# Patient Record
Sex: Male | Born: 1950 | Race: White | Hispanic: No | Marital: Married | Smoking: Former smoker
Health system: Southern US, Community
[De-identification: ages and names within clinical notes are randomized; demographics above are authoritative.]

## PROBLEM LIST (undated history)

## (undated) DIAGNOSIS — E785 Hyperlipidemia, unspecified: Secondary | ICD-10-CM

## (undated) DIAGNOSIS — C801 Malignant (primary) neoplasm, unspecified: Secondary | ICD-10-CM

## (undated) DIAGNOSIS — I10 Essential (primary) hypertension: Secondary | ICD-10-CM

---

## 1999-10-01 ENCOUNTER — Ambulatory Visit (HOSPITAL_BASED_OUTPATIENT_CLINIC_OR_DEPARTMENT_OTHER): Admission: RE | Admit: 1999-10-01 | Discharge: 1999-10-01 | Payer: Self-pay | Admitting: Urology

## 2006-06-29 ENCOUNTER — Ambulatory Visit: Payer: Self-pay | Admitting: Internal Medicine

## 2006-09-09 ENCOUNTER — Emergency Department (HOSPITAL_COMMUNITY): Admission: EM | Admit: 2006-09-09 | Discharge: 2006-09-09 | Payer: Self-pay | Admitting: Family Medicine

## 2007-07-18 ENCOUNTER — Encounter: Payer: Self-pay | Admitting: Internal Medicine

## 2008-06-25 ENCOUNTER — Emergency Department (HOSPITAL_COMMUNITY): Admission: EM | Admit: 2008-06-25 | Discharge: 2008-06-25 | Payer: Self-pay | Admitting: Emergency Medicine

## 2010-11-28 NOTE — Op Note (Signed)
[place] [place]  Patient:    Mike Donaldson, Mike Donaldson                       MRN: [id number] Proc. Date: 10/01/99 Adm. Date:  [id number] Attending:  Lauree Chandler                           Operative Report  PREOPERATIVE DIAGNOSIS:  Recurrent balanitis and phimosis.  POSTOPERATIVE DIAGNOSIS:  Recurrent balanitis and phimosis.  PROCEDURE:  Circumcision.  SURGEON:  Maretta Bees. Vonita Moss, M.D.  ANESTHESIA:  MAC with local.  INDICATIONS:  This 60 year old gentleman has had a recurrent history of cracking and splitting and irritation under the foreskin, and he has a tight foreskin that is redundant.  He is brought to the operating room for correction of this problem. He was advised about the risks of infection or bleeding.  DESCRIPTION OF PROCEDURE:  The patient was brought to the operating room and placed in the supine position.  The external genitalia were prepped and draped in the usual fashion.  Xylocaine 1% plain was injected around the base of the penis, to obtain local anesthesia.  A circumcision was performed using the sleeve technique. Hemostasis was obtained by the use of electrocautery and plain catgut ties, and  #4-0 chromic catgut was placed at 3, 6, 9, and 12 oclock, to approximately the distal edge of the penile shaft skin to the residual edge of the mucosal foreskin. Marcaine 0.25% was then injected proximally for postoperative analgesia.  The circumcision was then completed by running #4-0 chromic catgut between each quadrant suture.  The wound was cleaned and dressed with Betadine ointment and Vaseline gauze and dry sterile gauze and Coban.  He was taken to the recovery room in good condition, with a negligible blood loss. DD:  10/01/99 TD:  10/01/99 Job: [id number]

## 2018-02-10 ENCOUNTER — Observation Stay (HOSPITAL_COMMUNITY)
Admission: EM | Admit: 2018-02-10 | Discharge: 2018-02-12 | Disposition: A | Discharge status: Home / Self Care | Payer: Medicare Other | Attending: Cardiovascular Disease | Admitting: Cardiovascular Disease

## 2018-02-10 ENCOUNTER — Emergency Department (HOSPITAL_COMMUNITY): Payer: Medicare Other

## 2018-02-10 ENCOUNTER — Encounter (HOSPITAL_COMMUNITY): Payer: Self-pay | Admitting: Emergency Medicine

## 2018-02-10 ENCOUNTER — Other Ambulatory Visit: Payer: Self-pay

## 2018-02-10 DIAGNOSIS — R0789 Other chest pain: Secondary | ICD-10-CM | POA: Diagnosis not present

## 2018-02-10 DIAGNOSIS — K219 Gastro-esophageal reflux disease without esophagitis: Secondary | ICD-10-CM | POA: Insufficient documentation

## 2018-02-10 DIAGNOSIS — I10 Essential (primary) hypertension: Secondary | ICD-10-CM

## 2018-02-10 DIAGNOSIS — R001 Bradycardia, unspecified: Secondary | ICD-10-CM | POA: Insufficient documentation

## 2018-02-10 DIAGNOSIS — Z885 Allergy status to narcotic agent status: Secondary | ICD-10-CM | POA: Diagnosis not present

## 2018-02-10 DIAGNOSIS — Z79899 Other long term (current) drug therapy: Secondary | ICD-10-CM | POA: Diagnosis not present

## 2018-02-10 DIAGNOSIS — R072 Precordial pain: Secondary | ICD-10-CM | POA: Diagnosis not present

## 2018-02-10 DIAGNOSIS — R0602 Shortness of breath: Secondary | ICD-10-CM

## 2018-02-10 DIAGNOSIS — I2510 Atherosclerotic heart disease of native coronary artery without angina pectoris: Secondary | ICD-10-CM | POA: Diagnosis not present

## 2018-02-10 DIAGNOSIS — R0609 Other forms of dyspnea: Secondary | ICD-10-CM | POA: Insufficient documentation

## 2018-02-10 DIAGNOSIS — Z87891 Personal history of nicotine dependence: Secondary | ICD-10-CM | POA: Diagnosis not present

## 2018-02-10 DIAGNOSIS — E785 Hyperlipidemia, unspecified: Secondary | ICD-10-CM | POA: Insufficient documentation

## 2018-02-10 DIAGNOSIS — R079 Chest pain, unspecified: Secondary | ICD-10-CM | POA: Diagnosis present

## 2018-02-10 HISTORY — DX: Hyperlipidemia, unspecified: E78.5

## 2018-02-10 HISTORY — DX: Essential (primary) hypertension: I10

## 2018-02-10 LAB — BASIC METABOLIC PANEL
Anion gap: 10 (ref 5–15)
BUN: 16 mg/dL (ref 8–23)
CALCIUM: 8.7 mg/dL — AB (ref 8.9–10.3)
CHLORIDE: 107 mmol/L (ref 98–111)
CO2: 24 mmol/L (ref 22–32)
CREATININE: 1.17 mg/dL (ref 0.61–1.24)
Glucose, Bld: 138 mg/dL — ABNORMAL HIGH (ref 70–99)
Potassium: 3.4 mmol/L — ABNORMAL LOW (ref 3.5–5.1)
SODIUM: 141 mmol/L (ref 135–145)

## 2018-02-10 LAB — CBC
HCT: 45.8 % (ref 39.0–52.0)
Hemoglobin: 15.5 g/dL (ref 13.0–17.0)
MCH: 30.1 pg (ref 26.0–34.0)
MCHC: 33.8 g/dL (ref 30.0–36.0)
MCV: 88.9 fL (ref 78.0–100.0)
PLATELETS: 257 10*3/uL (ref 150–400)
RBC: 5.15 MIL/uL (ref 4.22–5.81)
RDW: 14 % (ref 11.5–15.5)
WBC: 7.9 10*3/uL (ref 4.0–10.5)

## 2018-02-10 LAB — TROPONIN I: Troponin I: <0.03 ng/mL (ref <0.03)

## 2018-02-10 LAB — I-STAT TROPONIN, ED: TROPONIN I, POC: 0 ng/mL (ref 0.00–0.08)

## 2018-02-10 LAB — BRAIN NATRIURETIC PEPTIDE: B Natriuretic Peptide: 65.8 pg/mL (ref 0.0–100.0)

## 2018-02-10 MED ORDER — ATORVASTATIN CALCIUM 40 MG PO TABS
40.0000 mg | ORAL_TABLET | Freq: Every day | ORAL | Status: DC
  Administered 2018-02-10 – 2018-02-11 (×2): 40 mg via ORAL
  Filled 2018-02-10 (×2): qty 1

## 2018-02-10 MED ORDER — AMLODIPINE BESYLATE 10 MG PO TABS
10.0000 mg | ORAL_TABLET | Freq: Every day | ORAL | Status: DC
  Administered 2018-02-11 – 2018-02-12 (×2): 10 mg via ORAL
  Filled 2018-02-10 (×2): qty 1

## 2018-02-10 MED ORDER — HYDROCHLOROTHIAZIDE 25 MG PO TABS
25.0000 mg | ORAL_TABLET | Freq: Every day | ORAL | Status: DC
  Administered 2018-02-11 – 2018-02-12 (×2): 25 mg via ORAL
  Filled 2018-02-10 (×2): qty 1

## 2018-02-10 MED ORDER — POTASSIUM CHLORIDE CRYS ER 20 MEQ PO TBCR
40.0000 meq | EXTENDED_RELEASE_TABLET | Freq: Every day | ORAL | Status: DC
  Administered 2018-02-11 – 2018-02-12 (×2): 40 meq via ORAL
  Filled 2018-02-10 (×2): qty 2

## 2018-02-10 MED ORDER — PANTOPRAZOLE SODIUM 40 MG PO TBEC
40.0000 mg | DELAYED_RELEASE_TABLET | Freq: Every day | ORAL | Status: DC
  Administered 2018-02-11 – 2018-02-12 (×2): 40 mg via ORAL
  Filled 2018-02-10 (×2): qty 1

## 2018-02-10 MED ORDER — METHOCARBAMOL 500 MG PO TABS: 500.0000 mg | ORAL_TABLET | Freq: Every evening | ORAL | Status: DC | PRN

## 2018-02-10 MED ORDER — ATORVASTATIN CALCIUM 20 MG PO TABS: 20.0000 mg | ORAL_TABLET | Freq: Every day | ORAL | Status: DC

## 2018-02-10 MED ORDER — ENOXAPARIN SODIUM 40 MG/0.4ML ~~LOC~~ SOLN
40.0000 mg | SUBCUTANEOUS | Status: DC
  Administered 2018-02-10 – 2018-02-11 (×2): 40 mg via SUBCUTANEOUS
  Filled 2018-02-10 (×2): qty 0.4

## 2018-02-10 MED ORDER — ACETAMINOPHEN 325 MG PO TABS
650.0000 mg | ORAL_TABLET | ORAL | Status: DC | PRN
  Administered 2018-02-12: 650 mg via ORAL
  Filled 2018-02-10: qty 2

## 2018-02-10 MED ORDER — NITROGLYCERIN 0.4 MG SL SUBL
0.4000 mg | SUBLINGUAL_TABLET | SUBLINGUAL | Status: DC | PRN
  Filled 2018-02-10: qty 1

## 2018-02-10 MED ORDER — ASPIRIN 81 MG PO CHEW
324.0000 mg | CHEWABLE_TABLET | Freq: Once | ORAL | Status: AC
Start: 2018-02-10 — End: 2018-02-10
  Administered 2018-02-10: 324 mg via ORAL
  Filled 2018-02-10: qty 4

## 2018-02-10 MED ORDER — METOPROLOL TARTRATE 25 MG PO TABS
12.5000 mg | ORAL_TABLET | Freq: Two times a day (BID) | ORAL | Status: DC
  Administered 2018-02-10: 12.5 mg via ORAL
  Filled 2018-02-10: qty 1

## 2018-02-10 MED ORDER — ASPIRIN EC 81 MG PO TBEC
81.0000 mg | DELAYED_RELEASE_TABLET | Freq: Every day | ORAL | Status: DC
  Administered 2018-02-11 – 2018-02-12 (×2): 81 mg via ORAL
  Filled 2018-02-10 (×2): qty 1

## 2018-02-10 MED ORDER — NITROGLYCERIN 0.4 MG SL SUBL: 0.4000 mg | SUBLINGUAL_TABLET | SUBLINGUAL | Status: DC | PRN

## 2018-02-10 MED ORDER — ONDANSETRON HCL 4 MG/2ML IJ SOLN: 4.0000 mg | Freq: Four times a day (QID) | INTRAMUSCULAR | Status: DC | PRN

## 2018-02-10 NOTE — ED Notes (Signed)
Pt declining Nitro at this time and states he is not currently experiencing any CP.

## 2018-02-10 NOTE — ED Triage Notes (Signed)
Pt states for the last 2 weeks he has been having intermittent episodes of chest pain associated with sob and lightheadedness. Pt was seen at [facility] clinic today and told him to come by ED just to be further evaluated.

## 2018-02-10 NOTE — Plan of Care (Signed)
Denies any pain at this time/.

## 2018-02-10 NOTE — ED Provider Notes (Signed)
[facility] EMERGENCY DEPARTMENT Provider Note   CSN: [id number] Arrival date & time: 02/10/18  1429     History   Chief Complaint Chief Complaint  Patient presents with  . Chest Pain    HPI Mike Donaldson. is a 67 y.o. male.  HPI  Pt was seen at 1620. Per pt and his wife, c/o gradual onset and worsening of multiple intermittent episodes of chest "pain" for the past 2 months, worse over the past 4 days. Pt states he has been having mid-sternal chest "heaviness" and "pressure," associated with SOB, lasting approximately 15 minutes each episode. Occurs on exertion, improves with rest. Pt states 4 days ago, he woke up from sleep with SOB, nausea, chest "heaviness" and fleeting 3 second "sharp" mid-sternal CP. Pt states the "heaviness" lasted for approximately 30-9min before resolving. Pt states he then sat up in a chair for the rest of the night. Pt states he told his PMD at the [place] today, then was sent to the ED for further evaluation. Denies vomiting/diarrhea, no back pain, no abd pain, no palpitations, no cough, no fevers, no calf/LE swelling.   Past Medical History:  Diagnosis Date  . Hyperlipidemia   . Hypertension     There are no active problems to display for this patient.   History reviewed. No pertinent surgical history.      Home Medications    Prior to Admission medications   Medication Sig Start Date End Date Taking? Authorizing Provider  amLODipine (NORVASC) 10 MG tablet Take 10 mg by mouth daily.   Yes [provider]  atorvastatin (LIPITOR) 20 MG tablet Take 20 mg by mouth daily at 6 PM.   Yes [provider]  Brinzolamide-Brimonidine 1-0.2 % SUSP Place 1 drop into both eyes 2 (two) times daily.   Yes [provider]  hydrochlorothiazide (HYDRODIURIL) 25 MG tablet Take 25 mg by mouth daily. Hold for systolic blood pressure less than 100   Yes [provider]  meloxicam (MOBIC) 7.5 MG tablet Take 7.5 mg by  mouth daily as needed for pain.   Yes [provider]  methocarbamol (ROBAXIN) 500 MG tablet Take 500 mg by mouth at bedtime as needed for muscle spasms.   Yes [provider]  metoprolol tartrate (LOPRESSOR) 25 MG tablet Take 12.5 mg by mouth 2 (two) times daily.   Yes [provider]  omeprazole (PRILOSEC) 20 MG capsule Take 20 mg by mouth daily.   Yes [provider]  potassium chloride SA (K-DUR,KLOR-CON) 20 MEQ tablet Take 40 mEq by mouth daily.   Yes [provider]    Family History No family history on file.  Social History Social History   Tobacco Use  . Smoking status: Not on file  Substance Use Topics  . Alcohol use: Not on file  . Drug use: Not on file     Allergies   Codeine   Review of Systems Review of Systems ROS: Statement: All systems negative except as marked or noted in the HPI; Constitutional: Negative for fever and chills. ; ; Eyes: Negative for eye pain, redness and discharge. ; ; ENMT: Negative for ear pain, hoarseness, nasal congestion, sinus pressure and sore throat. ; ; Cardiovascular: +CP, SOB. Negative for palpitations, diaphoresis, and peripheral edema. ; ; Respiratory: Negative for cough, wheezing and stridor. ; ; Gastrointestinal: +nausea. Negative for vomiting, diarrhea, abdominal pain, blood in stool, hematemesis, jaundice and rectal bleeding. . ; ; Genitourinary: Negative for  dysuria, flank pain and hematuria. ; ; Musculoskeletal: Negative for back pain and neck pain. Negative for swelling and trauma.; ; Skin: Negative for pruritus, rash, abrasions, blisters, bruising and skin lesion.; ; Neuro: Negative for headache, lightheadedness and neck stiffness. Negative for weakness, altered level of consciousness, altered mental status, extremity weakness, paresthesias, involuntary movement, seizure and syncope.       Physical Exam Updated Vital Signs BP (!) 147/72   Pulse (!) 55   Temp 98.4 F (36.9 C)  (Oral)   Resp 16   SpO2 97%    BP 138/88   Pulse (!) 55   Temp 98.4 F (36.9 C) (Oral)   Resp 13   SpO2 94%    Physical Exam 1625: Physical examination:  Nursing notes reviewed; Vital signs and O2 SAT reviewed;  Constitutional: Well developed, Well nourished, Well hydrated, In no acute distress; Head:  Normocephalic, atraumatic; Eyes: EOMI, PERRL, No scleral icterus; ENMT: Mouth and pharynx normal, Mucous membranes moist; Neck: Supple, Full range of motion, No lymphadenopathy; Cardiovascular: Bradycardic rate and rhythm, No gallop; Respiratory: Breath sounds clear & equal bilaterally, No wheezes.  Speaking full sentences with ease, Normal respiratory effort/excursion; Chest: Nontender, Movement normal; Abdomen: Soft, Nontender, Nondistended, Normal bowel sounds; Genitourinary: No CVA tenderness; Extremities: Peripheral pulses normal, No tenderness, No edema, No calf edema or asymmetry.; Neuro: AA&Ox3, Major CN grossly intact.  Speech clear. No gross focal motor or sensory deficits in extremities.; Skin: Color normal, Warm, Dry.   ED Treatments / Results  Labs (all labs ordered are listed, but only abnormal results are displayed)   EKG EKG Interpretation  Date/Time:  Thursday February 10 2018 14:36:22 EDT Ventricular Rate:  53 PR Interval:  176 QRS Duration: 96 QT Interval:  432 QTC Calculation: 405 R Axis:   9 Text Interpretation:  Sinus bradycardia with sinus arrhythmia Incomplete right bundle branch block No old tracing to compare Confirmed by Francine Graven [id number]) on 02/10/2018 4:30:42 PM   Radiology   Procedures Procedures (including critical care time)  Medications Ordered in ED Medications  aspirin chewable tablet 324 mg (has no administration in time range)  nitroGLYCERIN (NITROSTAT) SL tablet 0.4 mg (has no administration in time range)     Initial Impression / Assessment and Plan / ED Course  I have reviewed the triage vital signs and the nursing  notes.  Pertinent labs & imaging results that were available during my care of the patient were reviewed by me and considered in my medical decision making (see chart for details).  MDM Reviewed: nursing note and vitals Interpretation: labs, ECG and x-ray   Results for orders placed or performed during the hospital encounter of 11/94/17  Basic metabolic panel  Result Value Ref Range   Sodium 141 135 - 145 mmol/L   Potassium 3.4 (L) 3.5 - 5.1 mmol/L   Chloride 107 98 - 111 mmol/L   CO2 24 22 - 32 mmol/L   Glucose, Bld 138 (H) 70 - 99 mg/dL   BUN 16 8 - 23 mg/dL   Creatinine, Ser 1.17 0.61 - 1.24 mg/dL   Calcium 8.7 (L) 8.9 - 10.3 mg/dL   GFR calc non Af Amer >60 >60 mL/min   GFR calc Af Amer >60 >60 mL/min   Anion gap 10 5 - 15  CBC  Result Value Ref Range   WBC 7.9 4.0 - 10.5 K/uL   RBC 5.15 4.22 - 5.81 MIL/uL   Hemoglobin 15.5 13.0 - 17.0 g/dL   HCT 45.8  39.0 - 52.0 %   MCV 88.9 78.0 - 100.0 fL   MCH 30.1 26.0 - 34.0 pg   MCHC 33.8 30.0 - 36.0 g/dL   RDW 14.0 11.5 - 15.5 %   Platelets 257 150 - 400 K/uL  Brain natriuretic peptide  Result Value Ref Range   B Natriuretic Peptide 65.8 0.0 - 100.0 pg/mL  I-stat troponin, ED  Result Value Ref Range   Troponin i, poc 0.00 0.00 - 0.08 ng/mL   Comment 3           Dg Chest 2 View Result Date: 02/10/2018 CLINICAL DATA:  Shortness of breath x2 months EXAM: CHEST - 2 VIEW COMPARISON:  None. FINDINGS: Lungs are clear.  No pleural effusion or pneumothorax. The heart is normal in size. Mild degenerative changes of the visualized thoracolumbar spine. IMPRESSION: Normal chest radiographs. Electronically Signed   By: Julian Hy M.D.   On: 02/10/2018 15:02    1745:  ASA given. Pt denies symptoms while at rest/currently. Concerning HPI, will admit. Dx and testing d/w pt and family.  Questions answered.  Verb understanding, agreeable to admit.  T/C returned from [facility] Dr. Oval Linsey, case discussed, including:  HPI, pertinent PM/SHx,  VS/PE, dx testing, ED course and treatment:  Agrees regarding concerning HPI, requests to admit to [facility] service and [facility] can consult prn.   1815:  T/C returned from [facility] Dr. Shanon Brow, case discussed, including:  HPI, pertinent PM/SHx, VS/PE, dx testing, ED course and treatment:  Agreeable to admit.   1820:  Cards Dr. Oval Linsey has evaluated pt: states [facility] service will admit. [facility] MD informed.      Final Clinical Impressions(s) / ED Diagnoses   Final diagnoses:  None    ED Discharge Orders    None       Francine Graven, DO 02/13/18 1338

## 2018-02-10 NOTE — H&P (Signed)
Cardiology Admission History and Physical:   Patient ID: Mike Donaldson.; MRN: [id number]; DOB: 1950-09-30   Admission date: 02/10/2018  Primary Care Provider: Patient, No Pcp Per [place] Primary Cardiologist: [name], MD    Chief Complaint:  Chest pain  Patient Profile:   Mike Donaldson. is a 68 y.o. male with a hx of hypertension, carotid stenosis s/p L CEA, and GERD  who is being seen today for the evaluation of chest pain and shortness of breath.  History of Present Illness:   Mike Donaldson reports several months of intermittent chest pain.  He notices it most often when lying down or sitting.  The pain is substernal and doesn't radiate.  It is associated with shortness of breath but no nausea or diaphoresis.  The episodes last for up to 20 minutes at a time and seem to be better with walking.    He doesn't get much formal exercise but is very aive around his home and with his horses.  This doesn't cause chest pain but he does get short of breath.  The shortness of breath has been much worse in the last two weeks.   Past Medical History:  Diagnosis Date  . Hyperlipidemia   . Hypertension     History reviewed. No pertinent surgical history.   Medications Prior to Admission: Prior to Admission medications   Medication Sig Start Date End Date Taking? Authorizing Provider  amLODipine (NORVASC) 10 MG tablet Take 10 mg by mouth daily.   Yes [provider]  atorvastatin (LIPITOR) 20 MG tablet Take 20 mg by mouth daily at 6 PM.   Yes [provider]  Brinzolamide-Brimonidine 1-0.2 % SUSP Place 1 drop into both eyes 2 (two) times daily.   Yes [provider]  hydrochlorothiazide (HYDRODIURIL) 25 MG tablet Take 25 mg by mouth daily. Hold for systolic blood pressure less than 100   Yes [provider]  meloxicam (MOBIC) 7.5 MG tablet Take 7.5 mg by mouth daily as needed for pain.   Yes [provider]  methocarbamol (ROBAXIN) 500 MG  tablet Take 500 mg by mouth at bedtime as needed for muscle spasms.   Yes [provider]  metoprolol tartrate (LOPRESSOR) 25 MG tablet Take 12.5 mg by mouth 2 (two) times daily.   Yes [provider]  omeprazole (PRILOSEC) 20 MG capsule Take 20 mg by mouth daily.   Yes [provider]  potassium chloride SA (K-DUR,KLOR-CON) 20 MEQ tablet Take 40 mEq by mouth daily.   Yes [provider]     Allergies:    Allergies  Allergen Reactions  . Codeine Anaphylaxis    Social History:   Social History   Socioeconomic History  . Marital status: Married    Spouse name: Not on file  . Number of children: Not on file  . Years of education: Not on file  . Highest education level: Not on file  Occupational History  . Not on file  Social Needs  . Financial resource strain: Not on file  . Food insecurity:    Worry: Not on file    Inability: Not on file  . Transportation needs:    Medical: Not on file    Non-medical: Not on file  Tobacco Use  . Smoking status: Former Smoker    Types: Cigars  . Smokeless tobacco: Former Network engineer and Sexual Activity  . Alcohol use: Not on file  . Drug use: Not on file  .  Sexual activity: Not on file  Lifestyle  . Physical activity:    Days per week: Not on file    Minutes per session: Not on file  . Stress: Not on file  Relationships  . Social connections:    Talks on phone: Not on file    Gets together: Not on file    Attends religious service: Not on file    Active member of club or organization: Not on file    Attends meetings of clubs or organizations: Not on file    Relationship status: Not on file  . Intimate partner violence:    Fear of current or ex partner: Not on file    Emotionally abused: Not on file    Physically abused: Not on file    Forced sexual activity: Not on file  Other Topics Concern  . Not on file  Social History Narrative  . Not on file    Family History:   The patient's  family history includes Heart attack in his brother and mother.    ROS:  Please see the history of present illness.  All other ROS reviewed and negative.     Physical Exam/Data:   Vitals:   02/10/18 2200 02/11/18 0100 02/11/18 0104 02/11/18 0534  BP:   131/66 122/69  Pulse: (!) 56 (!) 48 (!) 42 (!) 50  Resp: 13 (!) 24 15 18   Temp:    97.9 F (36.6 C)  TempSrc:    Oral  SpO2: 99% 96% 99% 99%  Weight:    195 lb 15.8 oz (88.9 kg)  Height:        Intake/Output Summary (Last 24 hours) at 02/11/2018 0617 Last data filed at 02/11/2018 0103 Gross per 24 hour  Intake -  Output 470 ml  Net -470 ml   Filed Weights   02/10/18 2014 02/11/18 0534  Weight: 199 lb 4.7 oz (90.4 kg) 195 lb 15.8 oz (88.9 kg)  VS:  BP 122/69 (BP Location: Left Arm)   Pulse (!) 50   Temp 97.9 F (36.6 C) (Oral)   Resp 18   Ht 5\' 9"  (1.753 m)   Wt 195 lb 15.8 oz (88.9 kg)   SpO2 99%   BMI 28.94 kg/m  , BMI Body mass index is 28.94 kg/m. GENERAL:  Well appearing.  No acute distress HEENT: Pupils equal round and reactive, fundi not visualized, oral mucosa unremarkable NECK:  No jugular venous distention, waveform within normal limits, carotid upstroke brisk and symmetric, no bruits LUNGS:  Clear to auscultation bilaterally HEART:  RRR.  PMI not displaced or sustained,S1 and S2 within normal limits, no S3, no S4, no clicks, no rubs, no murmurs ABD:  Flat, positive bowel sounds normal in frequency in pitch, no bruits, no rebound, no guarding, no midline pulsatile mass, no hepatomegaly, no splenomegaly EXT:  2 plus pulses throughout, no edema, no cyanosis no clubbing SKIN:  No rashes no nodules NEURO:  Cranial nerves II through XII grossly intact, motor grossly intact throughout PSYCH:  Cognitively intact, oriented to person place and time   EKG:  The ECG that was done 02/10/18 was personally reviewed and demonstrates sinus bradycardia.  Rate 53 bpm.  Relevant CV Studies: n/a  Laboratory  Data:  Chemistry Recent Labs  Lab 02/10/18 1450  NA 141  K 3.4*  CL 107  CO2 24  GLUCOSE 138*  BUN 16  CREATININE 1.17  CALCIUM 8.7*  GFRNONAA >60  GFRAA >60  ANIONGAP 10  No results for input(s): PROT, ALBUMIN, AST, ALT, ALKPHOS, BILITOT in the last 168 hours. Hematology Recent Labs  Lab 02/10/18 1450  WBC 7.9  RBC 5.15  HGB 15.5  HCT 45.8  MCV 88.9  MCH 30.1  MCHC 33.8  RDW 14.0  PLT 257   Cardiac Enzymes Recent Labs  Lab 02/10/18 1855 02/11/18 0051  TROPONINI <0.03 <0.03    Recent Labs  Lab 02/10/18 1452  TROPIPOC 0.00    BNP Recent Labs  Lab 02/10/18 1450  BNP 65.8    DDimer No results for input(s): DDIMER in the last 168 hours.  Radiology/Studies:  Dg Chest 2 View  Result Date: 02/10/2018 CLINICAL DATA:  Shortness of breath x2 months EXAM: CHEST - 2 VIEW COMPARISON:  None. FINDINGS: Lungs are clear.  No pleural effusion or pneumothorax. The heart is normal in size. Mild degenerative changes of the visualized thoracolumbar spine. IMPRESSION: Normal chest radiographs. Electronically Signed   By: Julian Hy M.D.   On: 02/10/2018 15:02    Assessment and Plan:   # Chest pain: # Exertional dyspnea:   Mike Donaldson has atypical chest pain that occurs mostly at rest and when lying down.  However he also has exertional dyspnea that is concerning for ischemia.  Cycle cardiac enzymes.  If negative, we will get a Lexiscan Myoview and an echo.  # Hypertension: Continue home amlodipine, HCTZ and metoprolol.  # s/p L CEA: # Hyperlipidemia: Continue rosuvastatin and aspirin.  Check lipids.  He is [facility] at the [facility] for this.   Severity of Illness: The appropriate patient status for this patient is OBSERVATION. Observation status is judged to be reasonable and necessary in order to provide the required intensity of service to ensure the patient's safety. The patient's presenting symptoms, physical exam findings, and initial radiographic and  laboratory data in the context of their medical condition is felt to place them at decreased risk for further clinical deterioration. Furthermore, it is anticipated that the patient will be medically stable for discharge from the hospital within 2 midnights of admission. The following factors support the patient status of observation.   " The patient's presenting symptoms include atypical chest pain and shortness of breath. " The physical exam findings include  euvolemic. " The initial radiographic and laboratory data are troponin negative and EKG unremarkable thus far.     For questions or updates, please contact [place] Please consult [email or web address] for contact info under Cardiology/STEMI.    Signed, [name], MD  02/11/2018 6:17 AM

## 2018-02-10 NOTE — ED Notes (Signed)
Cardiologist notified about HR dipping into the 40's and then coming back up. Pt is asymptomatic.  Per Cardiology team and MD Oval Linsey, continue to monitor pt.

## 2018-02-10 NOTE — Progress Notes (Signed)
Patient admitted from ED with chest pain. Denies any pain at this time. Oriented to room and surroundings. Telemetry applied and ccmd called. Vitals stable. Family at bedside.

## 2018-02-10 NOTE — ED Provider Notes (Signed)
Patient placed in Quick Look pathway, seen and evaluated   Chief Complaint: Chest pain  HPI:   Patient presents with a 5-day history of intermittent chest pain.  He describes it as a pressure with intermittent, fleeting sharp pain that lasts for about 3 to 4 seconds.  He has also noticed worsening fatigue lately.  He reports he will work for 3 to 4 hours and become very tired, which is abnormal for him.  He has had associated shortness of breath.  His pain has come at rest and on exertion.  He reports that sitting up to seem to make it better.  He has had some bilateral lower extremity pain, but no significant edema that he is noticed.  He denies any recent long trips, surgeries, history of blood clots.  ROS: +Chest pain, shortness of breath  Physical Exam:   Gen: No distress  Neuro: Awake and Alert  Skin: Warm    Focused Exam: Heart normal rate rhythm, lungs clear to auscultation, abdomen soft and nontender, no calf tenderness   Initiation of care has begun. The patient has been counseled on the process, plan, and necessity for staying for the completion/evaluation, and the remainder of the medical screening examination    Frederica Kuster, PA-C 02/10/18 [place], DO 02/10/18 1536

## 2018-02-11 ENCOUNTER — Observation Stay (HOSPITAL_BASED_OUTPATIENT_CLINIC_OR_DEPARTMENT_OTHER): Payer: Medicare Other

## 2018-02-11 ENCOUNTER — Encounter (HOSPITAL_COMMUNITY): Payer: Self-pay | Admitting: Cardiovascular Disease

## 2018-02-11 DIAGNOSIS — R0600 Dyspnea, unspecified: Secondary | ICD-10-CM | POA: Diagnosis not present

## 2018-02-11 DIAGNOSIS — I2510 Atherosclerotic heart disease of native coronary artery without angina pectoris: Secondary | ICD-10-CM | POA: Diagnosis not present

## 2018-02-11 DIAGNOSIS — R0789 Other chest pain: Secondary | ICD-10-CM | POA: Diagnosis not present

## 2018-02-11 DIAGNOSIS — R0602 Shortness of breath: Secondary | ICD-10-CM | POA: Diagnosis not present

## 2018-02-11 DIAGNOSIS — I10 Essential (primary) hypertension: Secondary | ICD-10-CM | POA: Diagnosis not present

## 2018-02-11 DIAGNOSIS — R079 Chest pain, unspecified: Secondary | ICD-10-CM | POA: Diagnosis not present

## 2018-02-11 DIAGNOSIS — R072 Precordial pain: Secondary | ICD-10-CM | POA: Diagnosis not present

## 2018-02-11 DIAGNOSIS — R0609 Other forms of dyspnea: Secondary | ICD-10-CM | POA: Diagnosis not present

## 2018-02-11 LAB — BASIC METABOLIC PANEL
Anion gap: 12 (ref 5–15)
BUN: 16 mg/dL (ref 8–23)
CALCIUM: 8.6 mg/dL — AB (ref 8.9–10.3)
CHLORIDE: 106 mmol/L (ref 98–111)
CO2: 22 mmol/L (ref 22–32)
CREATININE: 1.16 mg/dL (ref 0.61–1.24)
GFR calc non Af Amer: >60 mL/min (ref >60)
GLUCOSE: 110 mg/dL — AB (ref 70–99)
Potassium: 3.4 mmol/L — ABNORMAL LOW (ref 3.5–5.1)
Sodium: 140 mmol/L (ref 135–145)

## 2018-02-11 LAB — CBC
HCT: 43.3 % (ref 39.0–52.0)
Hemoglobin: 14.6 g/dL (ref 13.0–17.0)
MCH: 29.8 pg (ref 26.0–34.0)
MCHC: 33.7 g/dL (ref 30.0–36.0)
MCV: 88.4 fL (ref 78.0–100.0)
PLATELETS: 254 10*3/uL (ref 150–400)
RBC: 4.9 MIL/uL (ref 4.22–5.81)
RDW: 14 % (ref 11.5–15.5)
WBC: 6.9 10*3/uL (ref 4.0–10.5)

## 2018-02-11 LAB — NM MYOCAR MULTI W/SPECT W/WALL MOTION / EF
CSEPPHR: 71 {beats}/min
MPHR: 153 {beats}/min
Percent HR: 46 %
Rest HR: 53 {beats}/min

## 2018-02-11 LAB — GLUCOSE, CAPILLARY: Glucose-Capillary: 118 mg/dL — ABNORMAL HIGH (ref 70–99)

## 2018-02-11 LAB — ECHOCARDIOGRAM COMPLETE
Height: 69 in
Weight: 3135.82 oz

## 2018-02-11 LAB — LIPID PANEL
Cholesterol: 108 mg/dL (ref 0–200)
HDL: 31 mg/dL — ABNORMAL LOW (ref >40)
LDL CALC: 52 mg/dL (ref 0–99)
TRIGLYCERIDES: 125 mg/dL (ref <150)
Total CHOL/HDL Ratio: 3.5 RATIO
VLDL: 25 mg/dL (ref 0–40)

## 2018-02-11 LAB — TROPONIN I
Troponin I: <0.03 ng/mL (ref <0.03)
Troponin I: <0.03 ng/mL (ref <0.03)

## 2018-02-11 LAB — HIV ANTIBODY (ROUTINE TESTING W REFLEX): HIV Screen 4th Generation wRfx: NONREACTIVE

## 2018-02-11 MED ORDER — REGADENOSON 0.4 MG/5ML IV SOLN
0.4000 mg | Freq: Once | INTRAVENOUS | Status: AC
  Administered 2018-02-11: 0.4 mg via INTRAVENOUS
  Filled 2018-02-11: qty 5

## 2018-02-11 MED ORDER — REGADENOSON 0.4 MG/5ML IV SOLN
INTRAVENOUS | Status: AC
  Filled 2018-02-11: qty 5

## 2018-02-11 MED ORDER — NON FORMULARY: 1.0000 [drp] | Freq: Two times a day (BID) | Status: DC

## 2018-02-11 MED ORDER — TECHNETIUM TC 99M TETROFOSMIN IV KIT
10.0000 | PACK | Freq: Once | INTRAVENOUS | Status: AC | PRN
  Administered 2018-02-11: 10 via INTRAVENOUS

## 2018-02-11 MED ORDER — TECHNETIUM TC 99M TETROFOSMIN IV KIT
30.0000 | PACK | Freq: Once | INTRAVENOUS | Status: AC | PRN
  Administered 2018-02-11: 30 via INTRAVENOUS

## 2018-02-11 NOTE — Progress Notes (Signed)
Patient heart rate is dropping to low 30's and 40's. Patient denies any discomfort or pain. EKG done SB. MD on call notified. Will continue to monitor.

## 2018-02-11 NOTE — Care Management Obs Status (Signed)
[facility] NOTIFICATION   Patient Details  Name: Mike Donaldson. MRN: [id number] Date of Birth: 20-Feb-1951   Medicare Observation Status Notification Given:  Yes    Carles Collet, RN 02/11/2018, 3:25 PM

## 2018-02-11 NOTE — Plan of Care (Signed)
Patient down for stress test, able to ambulate in room with minimal stand by assistance.  Patient HR in 50's education provided about d/c of metroplol

## 2018-02-11 NOTE — Progress Notes (Signed)
  Echocardiogram 2D Echocardiogram has been performed.  Jennette Dubin 02/11/2018, 3:39 PM

## 2018-02-11 NOTE — Progress Notes (Signed)
Progress Note  Patient Name: Mike Donaldson. Date of Encounter: 02/11/2018  Primary Cardiologist: [name], MD   Subjective   Feeling well.  No recurrent chest pain or shortness of breath.   Inpatient Medications    Scheduled Meds: . regadenoson      . amLODipine  10 mg Oral Daily  . aspirin EC  81 mg Oral Daily  . atorvastatin  40 mg Oral q1800  . enoxaparin (LOVENOX) injection  40 mg Subcutaneous Q24H  . hydrochlorothiazide  25 mg Oral Daily  . pantoprazole  40 mg Oral Daily  . potassium chloride SA  40 mEq Oral Daily   Continuous Infusions:  PRN Meds: acetaminophen, methocarbamol, nitroGLYCERIN, ondansetron (ZOFRAN) IV   Vital Signs    Vitals:   02/11/18 0954 02/11/18 1008 02/11/18 1009 02/11/18 1012  BP: (!) 156/63 125/62 124/62 123/61  Pulse:      Resp:      Temp:      TempSrc:      SpO2:      Weight:      Height:        Intake/Output Summary (Last 24 hours) at 02/11/2018 1102 Last data filed at 02/11/2018 0800 Gross per 24 hour  Intake 0 ml  Output 790 ml  Net -790 ml   Filed Weights   02/10/18 2014 02/11/18 0534  Weight: 199 lb 4.7 oz (90.4 kg) 195 lb 15.8 oz (88.9 kg)    Telemetry    Sinus bradycardia, sinus rhythm.  - Personally Reviewed  ECG    Sinus bradycardia.  Rate 45 bpm. - Personally Reviewed  Physical Exam   VS:  BP 123/61   Pulse (!) 50   Temp 97.9 F (36.6 C) (Oral)   Resp 18   Ht 5\' 9"  (1.753 m)   Wt 195 lb 15.8 oz (88.9 kg)   SpO2 99%   BMI 28.94 kg/m  , BMI Body mass index is 28.94 kg/m. GENERAL:  Well appearing HEENT: Pupils equal round and reactive, fundi not visualized, oral mucosa unremarkable NECK:  No jugular venous distention, waveform within normal limits, carotid upstroke brisk and symmetric, no bruits LUNGS:  Clear to auscultation bilaterally HEART:  RRR.  PMI not displaced or sustained,S1 and S2 within normal limits, no S3, no S4, no clicks, no rubs, no murmurs ABD:  Flat, positive bowel sounds  normal in frequency in pitch, no bruits, no rebound, no guarding, no midline pulsatile mass, no hepatomegaly, no splenomegaly EXT:  2 plus pulses throughout, no edema, no cyanosis no clubbing SKIN:  No rashes no nodules NEURO:  Cranial nerves II through XII grossly intact, motor grossly intact throughout [facility]:  Cognitively intact, oriented to person place and time   Labs    Chemistry Recent Labs  Lab 02/10/18 1450 02/11/18 0645  NA 141 140  K 3.4* 3.4*  CL 107 106  CO2 24 22  GLUCOSE 138* 110*  BUN 16 16  CREATININE 1.17 1.16  CALCIUM 8.7* 8.6*  GFRNONAA >60 >60  GFRAA >60 >60  ANIONGAP 10 12     Hematology Recent Labs  Lab 02/10/18 1450 02/11/18 0645  WBC 7.9 6.9  RBC 5.15 4.90  HGB 15.5 14.6  HCT 45.8 43.3  MCV 88.9 88.4  MCH 30.1 29.8  MCHC 33.8 33.7  RDW 14.0 14.0  PLT 257 254    Cardiac Enzymes Recent Labs  Lab 02/10/18 1855 02/11/18 0051 02/11/18 0645  TROPONINI <0.03 <0.03 <0.03    Recent  Labs  Lab 02/10/18 1452  TROPIPOC 0.00     BNP Recent Labs  Lab 02/10/18 1450  BNP 65.8     DDimer No results for input(s): DDIMER in the last 168 hours.   Radiology    Dg Chest 2 View  Result Date: 02/10/2018 CLINICAL DATA:  Shortness of breath x2 months EXAM: CHEST - 2 VIEW COMPARISON:  None. FINDINGS: Lungs are clear.  No pleural effusion or pneumothorax. The heart is normal in size. Mild degenerative changes of the visualized thoracolumbar spine. IMPRESSION: Normal chest radiographs. Electronically Signed   By: Julian Hy M.D.   On: 02/10/2018 15:02    Cardiac Studies   Echo pending Lexiscan Myoview pending  Patient Profile     67 y.o. male with a hx of hypertension, carotid stenosis s/p L CEA, and GERD  who is being seen today for the evaluation of chest pain and shortness of breath.  Assessment & Plan    # Chest pain: # Exertional dyspnea:   Mr. Rafferty has atypical chest pain that occurs mostly at rest and when lying down.   However he also has exertional dyspnea that is concerning for ischemia.  Cardiac enzymes are negative.  He is getting aLexiscan Myoview and echo is pending.  # Bradycardia: Asymptomatic bradycardia.  HR in the 30s even when awake.  Will stop metoprolol.  # Hypertension: Continue home amlodipine, HCTZ and stop metoprolol.  # s/p L CEA: # Hyperlipidemia: Continue rosuvastatin and aspirin.  LDL 52 this admission.  He is followed at the [facility] for this.    For questions or updates, please contact [place] Please consult [email or web address] for contact info under Cardiology/STEMI.      Signed, [name], MD  02/11/2018, 11:02 AM

## 2018-02-11 NOTE — Progress Notes (Addendum)
   Mike Donaldson. presented for a nuclear stress test today.  No immediate complications.  Stress imaging is pending at this time.  Preliminary EKG findings may be listed in the chart, but the stress test result will not be finalized until perfusion imaging is complete.  Repeat pressure prior to start of test was 156/53.  Tami Lin Santina Trillo, PA-C 02/11/2018, 10:18 AM

## 2018-02-12 ENCOUNTER — Encounter (HOSPITAL_COMMUNITY): Payer: Self-pay

## 2018-02-12 ENCOUNTER — Observation Stay (HOSPITAL_COMMUNITY): Payer: Medicare Other

## 2018-02-12 DIAGNOSIS — R079 Chest pain, unspecified: Secondary | ICD-10-CM | POA: Diagnosis not present

## 2018-02-12 DIAGNOSIS — R0602 Shortness of breath: Secondary | ICD-10-CM | POA: Diagnosis not present

## 2018-02-12 DIAGNOSIS — R0609 Other forms of dyspnea: Secondary | ICD-10-CM | POA: Diagnosis not present

## 2018-02-12 DIAGNOSIS — I2510 Atherosclerotic heart disease of native coronary artery without angina pectoris: Secondary | ICD-10-CM | POA: Diagnosis not present

## 2018-02-12 DIAGNOSIS — R0789 Other chest pain: Secondary | ICD-10-CM | POA: Diagnosis not present

## 2018-02-12 MED ORDER — IOPAMIDOL (ISOVUE-370) INJECTION 76%
INTRAVENOUS | Status: AC
  Administered 2018-02-12: 100 mL
  Filled 2018-02-12: qty 100

## 2018-02-12 NOTE — Progress Notes (Signed)
Pt heart rate drop during the night to the 30s while sleeping. The RR also drop to 5 with no sign of distress. Pt alert and oriented and answers question appropriately.

## 2018-02-12 NOTE — Discharge Summary (Signed)
Discharge Summary    Patient ID: Mike Donaldson.,  MRN: [id number], DOB/AGE: [birth year] 67 y.o.  Admit date: 02/10/2018 Discharge date: 02/12/2018  Primary Care Provider: Patient, No Pcp Per Primary Cardiologist: Mike Latch, MD  Discharge Diagnoses    Active Problems:   Chest pain   Shortness of breath   Essential hypertension   Allergies Allergies  Allergen Reactions  . Codeine Anaphylaxis    Diagnostic Studies/Procedures     NST 02/12/18 IMPRESSION: 1. No reversible ischemia or infarction.  2. Normal left ventricular wall motion.  3. Left ventricular ejection fraction 56%  4. Non invasive risk stratification*: Low  2D Echo 02/11/18 Study Conclusions  - Left ventricle: The cavity size was normal. Systolic function was   normal. The estimated ejection fraction was in the range of 55%   to 60%. Images were inadequate for LV wall motion assessment.   Features are consistent with a pseudonormal left ventricular   filling pattern, with concomitant abnormal relaxation and   increased filling pressure (grade 2 diastolic dysfunction). - Left atrium: The atrium was mildly dilated. - Tricuspid valve: There was trivial regurgitation.  Impressions:  - Overall EF appears preserved but inadequate study to assess   regional wall motion. Recommend limited echo with definity   contrast to assess further   History of Present Illness     [name] is a 67 y/o male with a h/o hypertension, carotid stenosis s/p L CEA, and GERD who presented to the [facility] ED w/ complaint of chest pain.    He reports several months of intermittent chest pain.  He notices it most often when lying down or sitting.  The pain is substernal and doesn't radiate.  It is associated with shortness of breath but no nausea or diaphoresis.  The episodes last for up to 20 minutes at a time and seem to be better with walking.    He doesn't get much formal exercise but is very aive around his home  and with his horses.  This doesn't cause chest pain but he does get short of breath.  The shortness of breath has been much worse in the last two weeks. Pt admitted by cardiology for further evaluation.    Hospital Course     Pt admitted to telemetry. Cardias enzymes were cycled and negative x 3. NST was performed on 02/11/18 and was negative for ischemia or infarction. EF was normal by stress test at 56%. Echo was also performed and showed normal EF at 55-60% and G2DD. No significant valvular disease. Chest CT was also performed and ruled out PE and PNA. No acute cardiopulmonary process noted. It was felt that his symptoms were perhaps anxiety driven, which he has a history of, per pt report.  Also of note, pt was observerrd to have asymptomatic bradycardia with occasional HRs in the 30s when awake, but no symptoms. His metoprolol was discontinued. Crestor was continued for DLD. Lipid panel showed controlled LDL at 52 mg/dL. Amlodipine and HCTZ were both continued for HTN.  Pt was last seen and examined by [name] who determined he was stable for discharge home. He will f/u with his PCP.   Consultants: none.     Discharge Vitals Blood pressure (!) 153/69, pulse (!) 54, temperature 98.8 F (37.1 C), temperature source Oral, resp. rate 17, height 5\' 9"  (1.753 m), weight 195 lb 15.8 oz (88.9 kg), SpO2 98 %.  Filed Weights   02/10/18 2014 02/11/18 0534 02/12/18 0631  Weight: 199 lb 4.7 oz (90.4 kg) 195 lb 15.8 oz (88.9 kg) 195 lb 15.8 oz (88.9 kg)    Labs & Radiologic Studies    CBC Recent Labs    02/10/18 1450 02/11/18 0645  WBC 7.9 6.9  HGB 15.5 14.6  HCT 45.8 43.3  MCV 88.9 88.4  PLT 257 132   Basic Metabolic Panel Recent Labs    02/10/18 1450 02/11/18 0645  NA 141 140  K 3.4* 3.4*  CL 107 106  CO2 24 22  GLUCOSE 138* 110*  BUN 16 16  CREATININE 1.17 1.16  CALCIUM 8.7* 8.6*   Liver Function Tests No results for input(s): AST, ALT, ALKPHOS, BILITOT, PROT, ALBUMIN in  the last 72 hours. No results for input(s): LIPASE, AMYLASE in the last 72 hours. Cardiac Enzymes Recent Labs    02/10/18 1855 02/11/18 0051 02/11/18 0645  TROPONINI <0.03 <0.03 <0.03   BNP Invalid input(s): POCBNP D-Dimer No results for input(s): DDIMER in the last 72 hours. Hemoglobin A1C No results for input(s): HGBA1C in the last 72 hours. Fasting Lipid Panel Recent Labs    02/11/18 0051  CHOL 108  HDL 31*  LDLCALC 52  TRIG 125  CHOLHDL 3.5   Thyroid Function Tests No results for input(s): TSH, T4TOTAL, T3FREE, THYROIDAB in the last 72 hours.  Invalid input(s): FREET3 _____________  Dg Chest 2 View  Result Date: 02/10/2018 CLINICAL DATA:  Shortness of breath x2 months EXAM: CHEST - 2 VIEW COMPARISON:  None. FINDINGS: Lungs are clear.  No pleural effusion or pneumothorax. The heart is normal in size. Mild degenerative changes of the visualized thoracolumbar spine. IMPRESSION: Normal chest radiographs. Electronically Signed   By: Mike Donaldson M.D.   On: 02/10/2018 15:02   Ct Angio Chest Pe W Or Wo Contrast  Result Date: 02/12/2018 CLINICAL DATA:  67 year old male with dyspnea and chest pain EXAM: CT ANGIOGRAPHY CHEST WITH CONTRAST TECHNIQUE: Multidetector CT imaging of the chest was performed using the standard protocol during bolus administration of intravenous contrast. Multiplanar CT image reconstructions and MIPs were obtained to evaluate the vascular anatomy. CONTRAST:  154mL ISOVUE-370 IOPAMIDOL (ISOVUE-370) INJECTION 76% COMPARISON:  Nuclear medicine cardiac stress test obtained yesterday FINDINGS: Cardiovascular: Satisfactory opacification of the pulmonary arteries to the segmental level. No evidence of pulmonary embolism. Normal heart size. No pericardial effusion. Two vessel aortic arch anatomy. The right brachiocephalic and left common carotid arteries share a common origin. Borderline cardiomegaly. Calcifications present within the coronary arteries.  Mediastinum/Nodes: Unremarkable CT appearance of the thyroid gland. No suspicious mediastinal or hilar adenopathy. No soft tissue mediastinal mass. The thoracic esophagus is unremarkable. Lungs/Pleura: Trace dependent atelectasis. The lungs are otherwise clear. No pleural effusion or pneumothorax. Upper Abdomen: No acute abnormality. Musculoskeletal: No acute fracture or aggressive appearing lytic or blastic osseous lesion. Review of the MIP images confirms the above findings. IMPRESSION: 1. Negative for acute pulmonary embolus, pneumonia or other acute cardiopulmonary process. 2. Borderline cardiomegaly. 3. Coronary artery calcifications. Electronically Signed   By: Jacqulynn Cadet M.D.   On: 02/12/2018 14:51   Nm Myocar Multi W/spect W/wall Motion / Ef  Result Date: 02/11/2018 CLINICAL DATA:  Chest pain.  Hypertension.  Previous smoker. EXAM: MYOCARDIAL IMAGING WITH SPECT (REST AND PHARMACOLOGIC-STRESS) GATED LEFT VENTRICULAR WALL MOTION STUDY LEFT VENTRICULAR EJECTION FRACTION TECHNIQUE: Standard myocardial SPECT imaging was performed after resting intravenous injection of 10 mCi Tc-8m tetrofosmin. Subsequently, intravenous infusion of Lexiscan was performed under the supervision of the Cardiology staff. At peak effect  of the drug, 30 mCi Tc-50m tetrofosmin was injected intravenously and standard myocardial SPECT imaging was performed. Quantitative gated imaging was also performed to evaluate left ventricular wall motion, and estimate left ventricular ejection fraction. COMPARISON:  Two-view chest x-ray 02/10/2018 FINDINGS: Perfusion: No decreased activity in the left ventricle on stress imaging to suggest reversible ischemia or infarction. Wall Motion: Normal left ventricular wall motion. No left ventricular dilation. Left Ventricular Ejection Fraction: 56 % End diastolic volume 449 ml End systolic volume 47 ml IMPRESSION: 1. No reversible ischemia or infarction. 2. Normal left ventricular wall motion. 3.  Left ventricular ejection fraction 56% 4. Non invasive risk stratification*: Low *2012 Appropriate Use Criteria for Coronary Revascularization Focused Update: J Am Coll Cardiol. 2010;07(1):219-758. http://content.[email or web address].aspx?articleid=[email or web address] Electronically Signed   By: San Morelle M.D.   On: 02/11/2018 14:26   Disposition   Pt is being discharged home today in good condition.  Follow-up Plans & Appointments    Follow-up Information    Primary Care Provider. Call in 2 week(s).   Why:  call to arrange follow-up with your primary care provider for hospital follow-up.          Discharge Instructions    Diet - low sodium heart healthy   Complete by:  As directed    Increase activity slowly   Complete by:  As directed       Discharge Medications   Allergies as of 02/12/2018      Reactions   Codeine Anaphylaxis      Medication List    STOP taking these medications   metoprolol tartrate 25 MG tablet Commonly known as:  LOPRESSOR     TAKE these medications   amLODipine 10 MG tablet Commonly known as:  NORVASC Take 10 mg by mouth daily.   atorvastatin 20 MG tablet Commonly known as:  LIPITOR Take 20 mg by mouth daily at 6 PM.   Brinzolamide-Brimonidine 1-0.2 % Susp Place 1 drop into both eyes 2 (two) times daily.   hydrochlorothiazide 25 MG tablet Commonly known as:  HYDRODIURIL Take 25 mg by mouth daily. Hold for systolic blood pressure less than 100   meloxicam 7.5 MG tablet Commonly known as:  MOBIC Take 7.5 mg by mouth daily as needed for pain.   methocarbamol 500 MG tablet Commonly known as:  ROBAXIN Take 500 mg by mouth at bedtime as needed for muscle spasms.   omeprazole 20 MG capsule Commonly known as:  PRILOSEC Take 20 mg by mouth daily.   potassium chloride SA 20 MEQ tablet Commonly known as:  K-DUR,KLOR-CON Take 40 mEq by mouth daily.        Acute coronary syndrome (MI, NSTEMI, STEMI, etc) this admission?: No.      Outstanding Labs/Studies   None   Duration of Discharge Encounter   Greater than 30 minutes including physician time.  Signed, Lyda Jester, PA-C 02/12/2018, 4:00 PM

## 2018-02-12 NOTE — Progress Notes (Signed)
Progress Note  Patient Name: Mike Donaldson. Date of Encounter: 02/12/2018  Primary Cardiologist: [name], MD   Subjective   Still with episodes of sharp pain in chest and dyspnea. He relates having anxiety attacks As teen that may have been similar Quit smoking years ago but still chews on a cigar With out lighting   Inpatient Medications    Scheduled Meds: . amLODipine  10 mg Oral Daily  . aspirin EC  81 mg Oral Daily  . atorvastatin  40 mg Oral q1800  . enoxaparin (LOVENOX) injection  40 mg Subcutaneous Q24H  . hydrochlorothiazide  25 mg Oral Daily  . pantoprazole  40 mg Oral Daily  . potassium chloride SA  40 mEq Oral Daily   Continuous Infusions:  PRN Meds: acetaminophen, methocarbamol, nitroGLYCERIN, ondansetron (ZOFRAN) IV   Vital Signs    Vitals:   02/12/18 0625 02/12/18 0631 02/12/18 0920 02/12/18 1050  BP: (!) 112/9  120/68   Pulse: (!) 44  (!) 43 (!) 45  Resp: 14  12 12   Temp: (!) 97.4 F (36.3 C)  98.8 F (37.1 C)   TempSrc: Oral  Oral   SpO2: 98%  97% 97%  Weight:  195 lb 15.8 oz (88.9 kg)    Height:        Intake/Output Summary (Last 24 hours) at 02/12/2018 1249 Last data filed at 02/12/2018 0900 Gross per 24 hour  Intake 660 ml  Output -  Net 660 ml   Filed Weights   02/10/18 2014 02/11/18 0534 02/12/18 0631  Weight: 199 lb 4.7 oz (90.4 kg) 195 lb 15.8 oz (88.9 kg) 195 lb 15.8 oz (88.9 kg)    Telemetry    Sinus bradycardia, sinus rhythm.  - Personally Reviewed  ECG    Sinus bradycardia.  Rate 45 bpm. - Personally Reviewed  Physical Exam   VS:  BP 120/68 (BP Location: Left Arm)   Pulse (!) 45   Temp 98.8 F (37.1 C) (Oral)   Resp 12   Ht 5\' 9"  (1.753 m)   Wt 195 lb 15.8 oz (88.9 kg)   SpO2 97%   BMI 28.94 kg/m  , BMI Body mass index is 28.94 kg/m. Affect appropriate Looks older than stated age  104: normal Neck supple with no adenopathy JVP normal left CEA no thyromegaly Lungs clear with no wheezing and good  diaphragmatic motion Heart:  S1/S2 no murmur, no rub, gallop or click PMI normal Abdomen: benighn, BS positve, no tenderness, no AAA no bruit.  No HSM or HJR Distal pulses intact with no bruits No edema Neuro non-focal Skin warm and dry No muscular weakness    Labs    Chemistry Recent Labs  Lab 02/10/18 1450 02/11/18 0645  NA 141 140  K 3.4* 3.4*  CL 107 106  CO2 24 22  GLUCOSE 138* 110*  BUN 16 16  CREATININE 1.17 1.16  CALCIUM 8.7* 8.6*  GFRNONAA >60 >60  GFRAA >60 >60  ANIONGAP 10 12     Hematology Recent Labs  Lab 02/10/18 1450 02/11/18 0645  WBC 7.9 6.9  RBC 5.15 4.90  HGB 15.5 14.6  HCT 45.8 43.3  MCV 88.9 88.4  MCH 30.1 29.8  MCHC 33.8 33.7  RDW 14.0 14.0  PLT 257 254    Cardiac Enzymes Recent Labs  Lab 02/10/18 1855 02/11/18 0051 02/11/18 0645  TROPONINI <0.03 <0.03 <0.03    Recent Labs  Lab 02/10/18 1452  TROPIPOC 0.00  BNP Recent Labs  Lab 02/10/18 1450  BNP 65.8     DDimer No results for input(s): DDIMER in the last 168 hours.   Radiology    Dg Chest 2 View  Result Date: 02/10/2018 CLINICAL DATA:  Shortness of breath x2 months EXAM: CHEST - 2 VIEW COMPARISON:  None. FINDINGS: Lungs are clear.  No pleural effusion or pneumothorax. The heart is normal in size. Mild degenerative changes of the visualized thoracolumbar spine. IMPRESSION: Normal chest radiographs. Electronically Signed   By: Julian Hy M.D.   On: 02/10/2018 15:02   Nm Myocar Multi W/spect W/wall Motion / Ef  Result Date: 02/11/2018 CLINICAL DATA:  Chest pain.  Hypertension.  Previous smoker. EXAM: MYOCARDIAL IMAGING WITH SPECT (REST AND PHARMACOLOGIC-STRESS) GATED LEFT VENTRICULAR WALL MOTION STUDY LEFT VENTRICULAR EJECTION FRACTION TECHNIQUE: Standard myocardial SPECT imaging was performed after resting intravenous injection of 10 mCi Tc-45m tetrofosmin. Subsequently, intravenous infusion of Lexiscan was performed under the supervision of the Cardiology  staff. At peak effect of the drug, 30 mCi Tc-4m tetrofosmin was injected intravenously and standard myocardial SPECT imaging was performed. Quantitative gated imaging was also performed to evaluate left ventricular wall motion, and estimate left ventricular ejection fraction. COMPARISON:  Two-view chest x-ray 02/10/2018 FINDINGS: Perfusion: No decreased activity in the left ventricle on stress imaging to suggest reversible ischemia or infarction. Wall Motion: Normal left ventricular wall motion. No left ventricular dilation. Left Ventricular Ejection Fraction: 56 % End diastolic volume 001 ml End systolic volume 47 ml IMPRESSION: 1. No reversible ischemia or infarction. 2. Normal left ventricular wall motion. 3. Left ventricular ejection fraction 56% 4. Non invasive risk stratification*: Low *2012 Appropriate Use Criteria for Coronary Revascularization Focused Update: J Am Coll Cardiol. 7494;49(6):759-163. http://content.airportbarriers.com.aspx?articleid=[email or web address] Electronically Signed   By: San Morelle M.D.   On: 02/11/2018 14:26    Cardiac Studies   Echo with normal EF no pulmonary HTN  55-60% Myovue normal no ischemia EF 56%   Patient Profile     67 y.o. male with a hx of hypertension, carotid stenosis s/p L CEA, and GERD  who is being seen today for the evaluation of chest pain and shortness of breath.  Assessment & Plan    # Chest pain: # Exertional dyspnea:   Etiology not clear Normal exam sats 95-98% CXR, Myovue and echo all normal will order CTA to r/o PE If negative can be d/c home latter today Anxiety may be playing a role   # Bradycardia: Asymptomatic bradycardia.  HR in the 30s even when awake.  Toprol d/c   # Hypertension: Continue home amlodipine, HCTZ and stop metoprolol.  # s/p L CEA: # Hyperlipidemia: Continue rosuvastatin and aspirin.  LDL 52 this admission.  He is followed at the [facility] for this.   Jenkins Rouge

## 2018-02-12 NOTE — Progress Notes (Signed)
Pt discharge instruction/education provided, vital signs stable, IV removed.  After visit summary explained and questions about discharge care answered.

## 2019-07-04 IMAGING — CT CT ANGIO CHEST
2 of 6 series · 18 of 36 positions shown · IV contrast (Omni 300)
Comparison: Nuclear medicine cardiac stress test obtained yesterday

CLINICAL DATA: 67-year-old male with dyspnea and chest pain

EXAM:
CT ANGIOGRAPHY CHEST WITH CONTRAST
TECHNIQUE: Multidetector CT imaging of the chest was performed using the
standard protocol during bolus administration of intravenous
contrast. Multiplanar CT image reconstructions and MIPs were
obtained to evaluate the vascular anatomy.
CONTRAST:  100mL 97LCVO-LHZ IOPAMIDOL (97LCVO-LHZ) INJECTION 76%

[Series 7: pe thins · axial · 0.70mm/px · z∈[+1344,+1551]mm · 17 of 233 slices shown]
[im 13/233  lung]
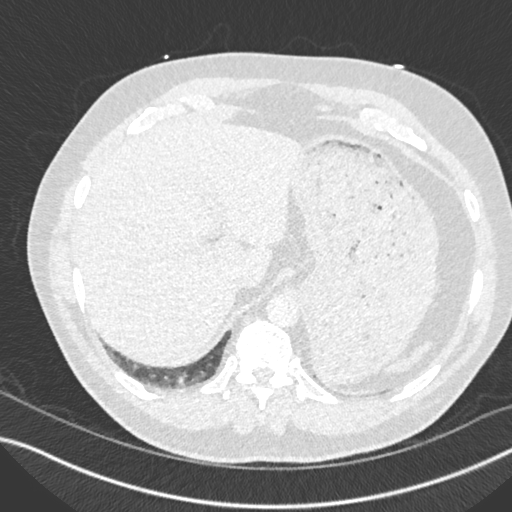
[im 26/233  mediastinal]
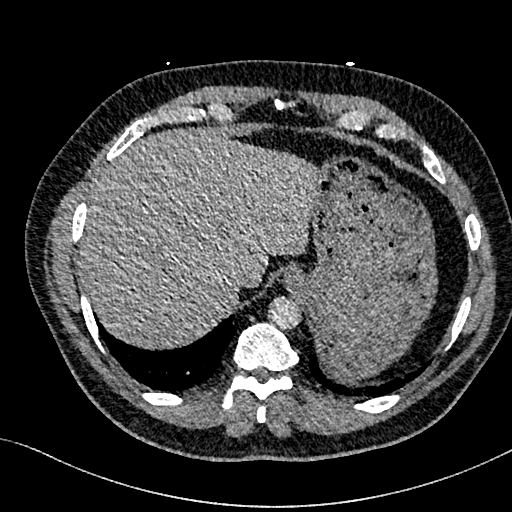
[im 39/233  lung]
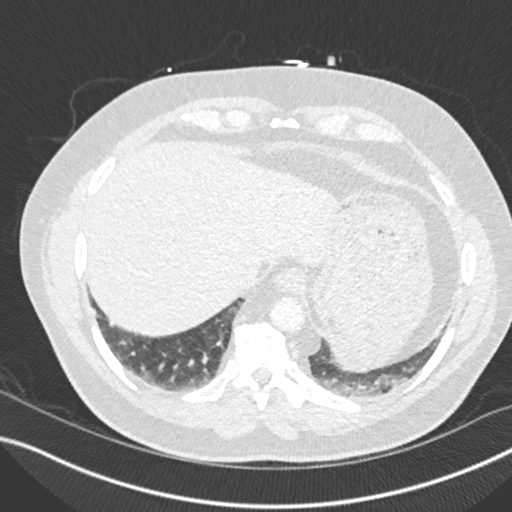
[im 52/233  mediastinal]
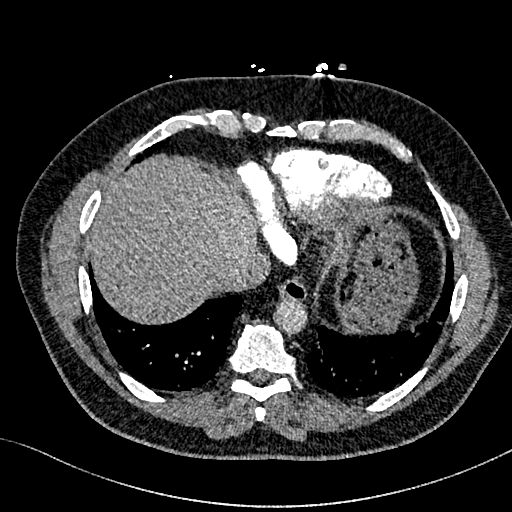
[im 65/233  lung]
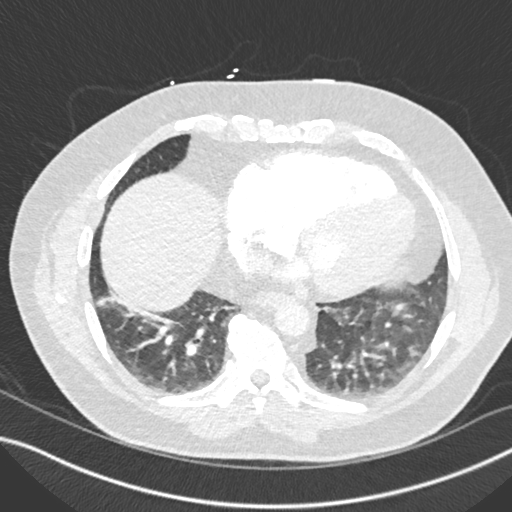
[im 78/233  mediastinal]
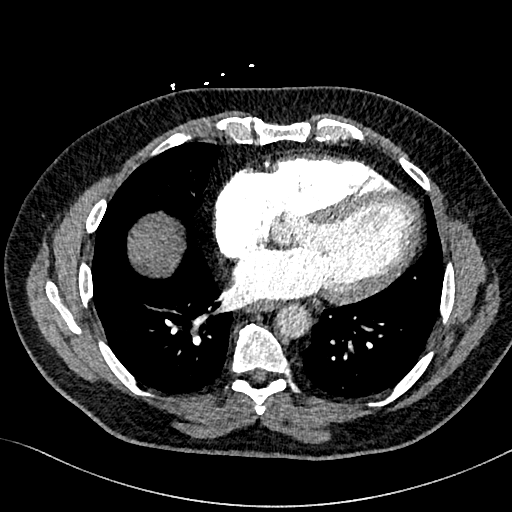
[im 91/233  lung]
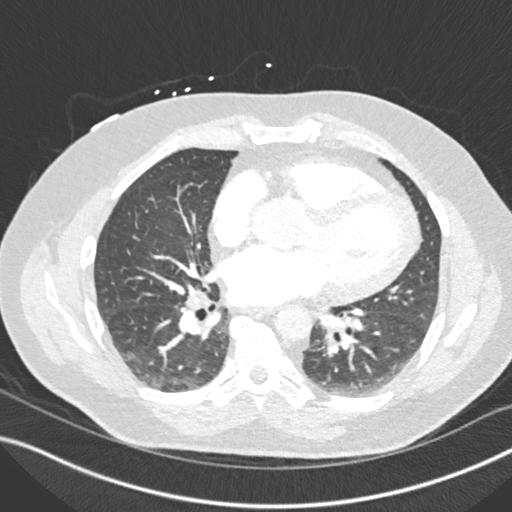
[im 104/233  mediastinal]
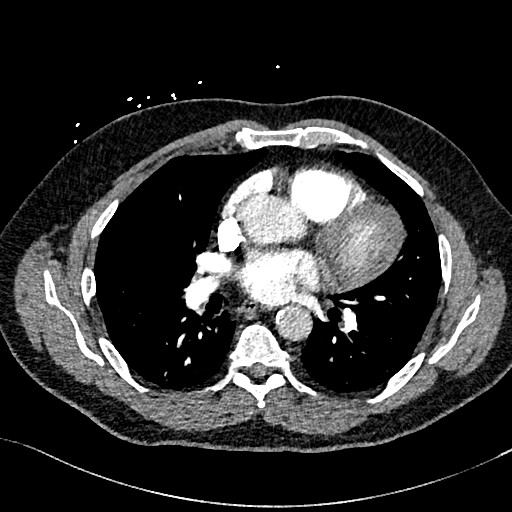
[im 117/233  lung]
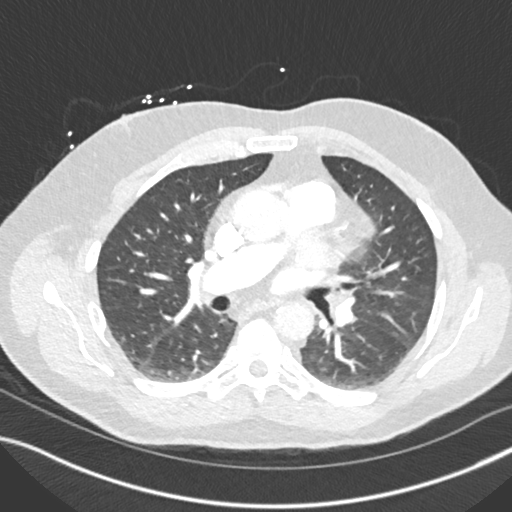
[im 129/233  mediastinal]
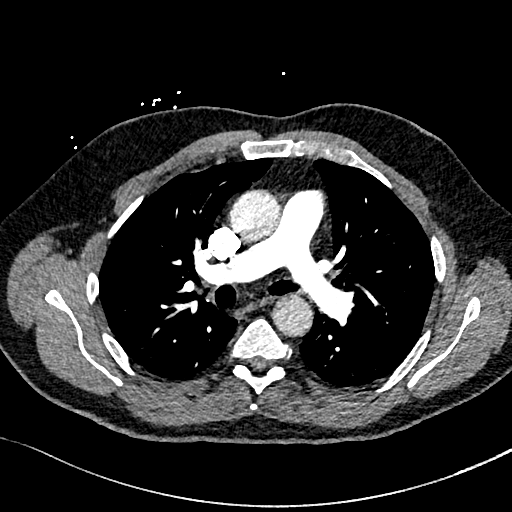
[im 142/233  lung]
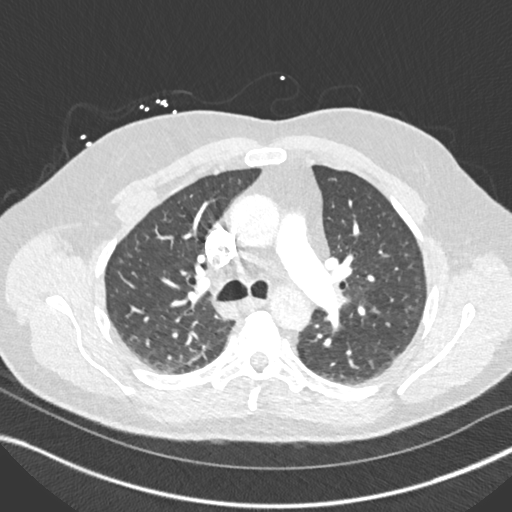
[im 155/233  mediastinal]
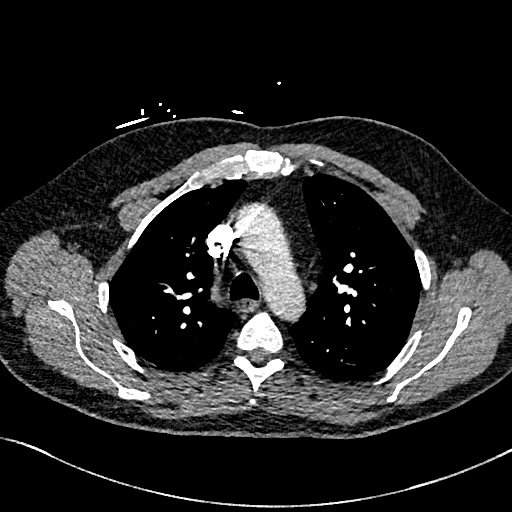
[im 168/233  lung]
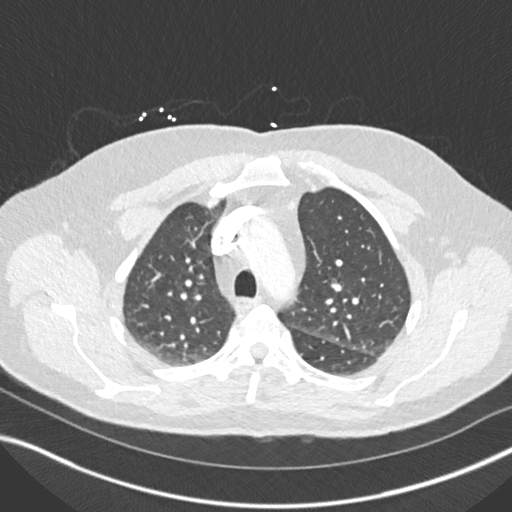
[im 181/233  mediastinal]
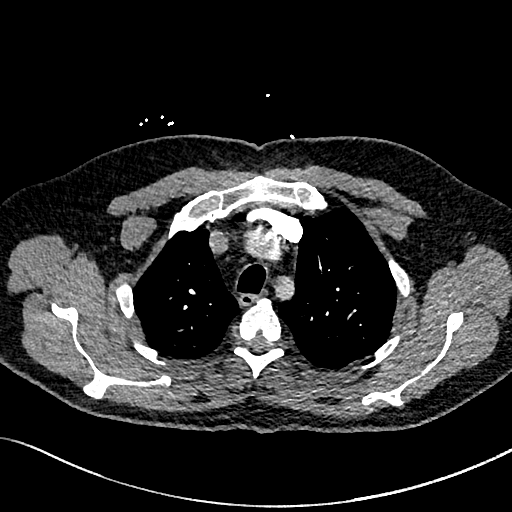
[im 194/233  lung]
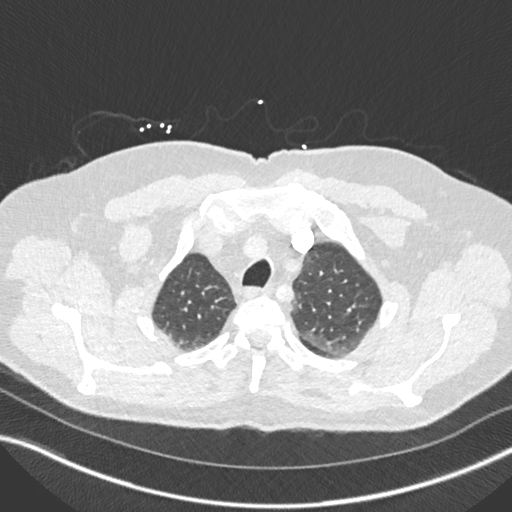
[im 207/233  mediastinal]
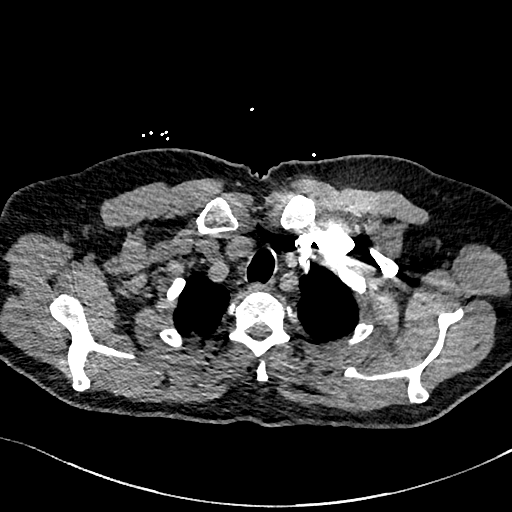
[im 220/233  lung]
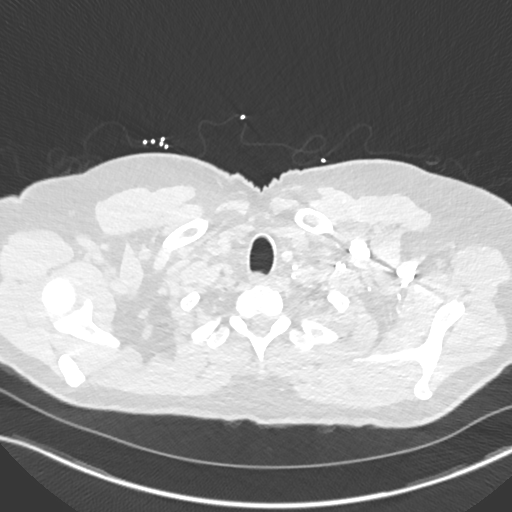

[Series 8: pe 2mm cor · coronal · 0.59mm/px · 1 of 151 slices shown]
[im 76/151  mediastinal]
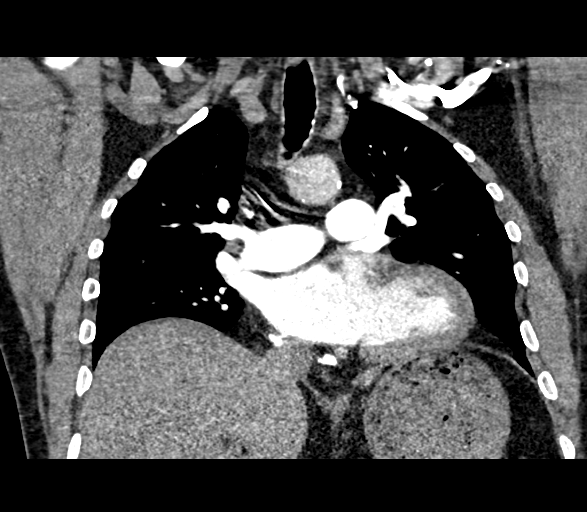

[18 of 36 positions shown; findings below may reference images not displayed]

FINDINGS: Cardiovascular: Satisfactory opacification of the pulmonary arteries
to the segmental level. No evidence of pulmonary embolism. Normal
heart size. No pericardial effusion. Two vessel aortic arch anatomy.
The right brachiocephalic and left common carotid arteries share a
common origin. Borderline cardiomegaly. Calcifications present
within the coronary arteries.

Mediastinum/Nodes: Unremarkable CT appearance of the thyroid gland.
No suspicious mediastinal or hilar adenopathy. No soft tissue
mediastinal mass. The thoracic esophagus is unremarkable.

Lungs/Pleura: Trace dependent atelectasis. The lungs are otherwise
clear. No pleural effusion or pneumothorax.

Upper Abdomen: No acute abnormality.

Musculoskeletal: No acute fracture or aggressive appearing lytic or
blastic osseous lesion.

Review of the MIP images confirms the above findings.
IMPRESSION: 1. Negative for acute pulmonary embolus, pneumonia or other acute
cardiopulmonary process.
2. Borderline cardiomegaly.
3. Coronary artery calcifications.

## 2021-05-13 DIAGNOSIS — R17 Unspecified jaundice: Secondary | ICD-10-CM | POA: Diagnosis not present

## 2021-05-13 DIAGNOSIS — R7401 Elevation of levels of liver transaminase levels: Secondary | ICD-10-CM | POA: Diagnosis not present

## 2021-05-13 DIAGNOSIS — I10 Essential (primary) hypertension: Secondary | ICD-10-CM | POA: Diagnosis not present

## 2021-05-13 DIAGNOSIS — R1900 Intra-abdominal and pelvic swelling, mass and lump, unspecified site: Secondary | ICD-10-CM | POA: Diagnosis not present

## 2021-05-13 DIAGNOSIS — R1030 Lower abdominal pain, unspecified: Secondary | ICD-10-CM | POA: Diagnosis not present

## 2021-05-13 DIAGNOSIS — E785 Hyperlipidemia, unspecified: Secondary | ICD-10-CM | POA: Diagnosis not present

## 2021-05-13 DIAGNOSIS — L299 Pruritus, unspecified: Secondary | ICD-10-CM | POA: Diagnosis not present

## 2021-05-13 DIAGNOSIS — R195 Other fecal abnormalities: Secondary | ICD-10-CM | POA: Diagnosis not present

## 2021-05-15 DIAGNOSIS — R1900 Intra-abdominal and pelvic swelling, mass and lump, unspecified site: Secondary | ICD-10-CM | POA: Diagnosis not present

## 2021-12-22 DIAGNOSIS — K219 Gastro-esophageal reflux disease without esophagitis: Secondary | ICD-10-CM | POA: Diagnosis not present

## 2021-12-22 DIAGNOSIS — C221 Intrahepatic bile duct carcinoma: Secondary | ICD-10-CM | POA: Diagnosis not present

## 2021-12-22 DIAGNOSIS — I10 Essential (primary) hypertension: Secondary | ICD-10-CM | POA: Diagnosis not present

## 2021-12-22 DIAGNOSIS — E7800 Pure hypercholesterolemia, unspecified: Secondary | ICD-10-CM | POA: Diagnosis not present

## 2021-12-22 DIAGNOSIS — Z8679 Personal history of other diseases of the circulatory system: Secondary | ICD-10-CM | POA: Diagnosis not present

## 2021-12-22 DIAGNOSIS — Z86711 Personal history of pulmonary embolism: Secondary | ICD-10-CM | POA: Diagnosis not present

## 2022-01-22 ENCOUNTER — Emergency Department (HOSPITAL_COMMUNITY): Payer: Medicare Other

## 2022-01-22 ENCOUNTER — Encounter (HOSPITAL_COMMUNITY): Payer: Self-pay | Admitting: Emergency Medicine

## 2022-01-22 ENCOUNTER — Other Ambulatory Visit: Payer: Self-pay

## 2022-01-22 ENCOUNTER — Inpatient Hospital Stay (HOSPITAL_COMMUNITY)
Admission: EM | Admit: 2022-01-22 | Discharge: 2022-01-26 | DRG: 871 | Disposition: A | Discharge status: Home / Self Care | Payer: Medicare Other | Attending: Internal Medicine | Admitting: Internal Medicine

## 2022-01-22 DIAGNOSIS — D630 Anemia in neoplastic disease: Secondary | ICD-10-CM | POA: Diagnosis present

## 2022-01-22 DIAGNOSIS — Z791 Long term (current) use of non-steroidal anti-inflammatories (NSAID): Secondary | ICD-10-CM

## 2022-01-22 DIAGNOSIS — R6520 Severe sepsis without septic shock: Secondary | ICD-10-CM | POA: Diagnosis not present

## 2022-01-22 DIAGNOSIS — G9340 Encephalopathy, unspecified: Secondary | ICD-10-CM | POA: Diagnosis present

## 2022-01-22 DIAGNOSIS — G4733 Obstructive sleep apnea (adult) (pediatric): Secondary | ICD-10-CM | POA: Diagnosis present

## 2022-01-22 DIAGNOSIS — A4159 Other Gram-negative sepsis: Secondary | ICD-10-CM | POA: Diagnosis not present

## 2022-01-22 DIAGNOSIS — R918 Other nonspecific abnormal finding of lung field: Secondary | ICD-10-CM | POA: Diagnosis not present

## 2022-01-22 DIAGNOSIS — I10 Essential (primary) hypertension: Secondary | ICD-10-CM | POA: Diagnosis present

## 2022-01-22 DIAGNOSIS — Z79899 Other long term (current) drug therapy: Secondary | ICD-10-CM

## 2022-01-22 DIAGNOSIS — E876 Hypokalemia: Secondary | ICD-10-CM | POA: Diagnosis present

## 2022-01-22 DIAGNOSIS — G893 Neoplasm related pain (acute) (chronic): Secondary | ICD-10-CM | POA: Diagnosis not present

## 2022-01-22 DIAGNOSIS — J189 Pneumonia, unspecified organism: Secondary | ICD-10-CM | POA: Diagnosis present

## 2022-01-22 DIAGNOSIS — Z8249 Family history of ischemic heart disease and other diseases of the circulatory system: Secondary | ICD-10-CM | POA: Diagnosis not present

## 2022-01-22 DIAGNOSIS — N179 Acute kidney failure, unspecified: Secondary | ICD-10-CM | POA: Diagnosis not present

## 2022-01-22 DIAGNOSIS — G8929 Other chronic pain: Secondary | ICD-10-CM | POA: Diagnosis not present

## 2022-01-22 DIAGNOSIS — R008 Other abnormalities of heart beat: Secondary | ICD-10-CM | POA: Diagnosis present

## 2022-01-22 DIAGNOSIS — G9341 Metabolic encephalopathy: Secondary | ICD-10-CM | POA: Diagnosis present

## 2022-01-22 DIAGNOSIS — E785 Hyperlipidemia, unspecified: Secondary | ICD-10-CM | POA: Diagnosis not present

## 2022-01-22 DIAGNOSIS — K5900 Constipation, unspecified: Secondary | ICD-10-CM | POA: Diagnosis not present

## 2022-01-22 DIAGNOSIS — Z20822 Contact with and (suspected) exposure to covid-19: Secondary | ICD-10-CM | POA: Diagnosis present

## 2022-01-22 DIAGNOSIS — R9431 Abnormal electrocardiogram [ECG] [EKG]: Secondary | ICD-10-CM | POA: Diagnosis not present

## 2022-01-22 DIAGNOSIS — Z86711 Personal history of pulmonary embolism: Secondary | ICD-10-CM

## 2022-01-22 DIAGNOSIS — Z9221 Personal history of antineoplastic chemotherapy: Secondary | ICD-10-CM

## 2022-01-22 DIAGNOSIS — Z87891 Personal history of nicotine dependence: Secondary | ICD-10-CM | POA: Diagnosis not present

## 2022-01-22 DIAGNOSIS — I493 Ventricular premature depolarization: Secondary | ICD-10-CM | POA: Diagnosis not present

## 2022-01-22 DIAGNOSIS — C801 Malignant (primary) neoplasm, unspecified: Secondary | ICD-10-CM | POA: Diagnosis present

## 2022-01-22 DIAGNOSIS — C799 Secondary malignant neoplasm of unspecified site: Secondary | ICD-10-CM | POA: Diagnosis present

## 2022-01-22 DIAGNOSIS — I4891 Unspecified atrial fibrillation: Secondary | ICD-10-CM | POA: Diagnosis present

## 2022-01-22 DIAGNOSIS — C221 Intrahepatic bile duct carcinoma: Secondary | ICD-10-CM | POA: Diagnosis not present

## 2022-01-22 DIAGNOSIS — E860 Dehydration: Secondary | ICD-10-CM | POA: Diagnosis present

## 2022-01-22 DIAGNOSIS — Z7901 Long term (current) use of anticoagulants: Secondary | ICD-10-CM

## 2022-01-22 DIAGNOSIS — R000 Tachycardia, unspecified: Secondary | ICD-10-CM | POA: Diagnosis not present

## 2022-01-22 HISTORY — DX: Malignant (primary) neoplasm, unspecified: C80.1

## 2022-01-22 LAB — COMPREHENSIVE METABOLIC PANEL
ALT: 30 U/L (ref 0–44)
AST: 48 U/L — ABNORMAL HIGH (ref 15–41)
Albumin: 2.9 g/dL — ABNORMAL LOW (ref 3.5–5.0)
Alkaline Phosphatase: 111 U/L (ref 38–126)
Anion gap: 11 (ref 5–15)
BUN: 46 mg/dL — ABNORMAL HIGH (ref 8–23)
CO2: 26 mmol/L (ref 22–32)
Calcium: 8.6 mg/dL — ABNORMAL LOW (ref 8.9–10.3)
Chloride: 93 mmol/L — ABNORMAL LOW (ref 98–111)
Creatinine, Ser: 1.72 mg/dL — ABNORMAL HIGH (ref 0.61–1.24)
GFR, Estimated: 42 mL/min — ABNORMAL LOW (ref >60)
Glucose, Bld: 187 mg/dL — ABNORMAL HIGH (ref 70–99)
Potassium: 3.5 mmol/L (ref 3.5–5.1)
Sodium: 130 mmol/L — ABNORMAL LOW (ref 135–145)
Total Bilirubin: 0.7 mg/dL (ref 0.3–1.2)
Total Protein: 6.5 g/dL (ref 6.5–8.1)

## 2022-01-22 LAB — CBC WITH DIFFERENTIAL/PLATELET
Abs Immature Granulocytes: 0.29 10*3/uL — ABNORMAL HIGH (ref 0.00–0.07)
Basophils Absolute: 0 10*3/uL (ref 0.0–0.1)
Basophils Relative: 0 %
Eosinophils Absolute: 0 10*3/uL (ref 0.0–0.5)
Eosinophils Relative: 0 %
HCT: 27.6 % — ABNORMAL LOW (ref 39.0–52.0)
Hemoglobin: 9.4 g/dL — ABNORMAL LOW (ref 13.0–17.0)
Immature Granulocytes: 3 %
Lymphocytes Relative: 4 %
Lymphs Abs: 0.5 10*3/uL — ABNORMAL LOW (ref 0.7–4.0)
MCH: 29.7 pg (ref 26.0–34.0)
MCHC: 34.1 g/dL (ref 30.0–36.0)
MCV: 87.1 fL (ref 80.0–100.0)
Monocytes Absolute: 1.6 10*3/uL — ABNORMAL HIGH (ref 0.1–1.0)
Monocytes Relative: 13 %
Neutro Abs: 9.4 10*3/uL — ABNORMAL HIGH (ref 1.7–7.7)
Neutrophils Relative %: 80 %
Platelets: 345 10*3/uL (ref 150–400)
RBC: 3.17 MIL/uL — ABNORMAL LOW (ref 4.22–5.81)
RDW: 15 % (ref 11.5–15.5)
WBC: 11.8 10*3/uL — ABNORMAL HIGH (ref 4.0–10.5)
nRBC: 0 % (ref 0.0–0.2)

## 2022-01-22 LAB — MAGNESIUM: Magnesium: 1.7 mg/dL (ref 1.7–2.4)

## 2022-01-22 LAB — RESP PANEL BY RT-PCR (FLU A&B, COVID) ARPGX2
Influenza A by PCR: NEGATIVE
Influenza B by PCR: NEGATIVE
SARS Coronavirus 2 by RT PCR: NEGATIVE

## 2022-01-22 LAB — AMMONIA: Ammonia: 17 umol/L (ref 9–35)

## 2022-01-22 LAB — LACTIC ACID, PLASMA
Lactic Acid, Venous: 1.9 mmol/L (ref 0.5–1.9)
Lactic Acid, Venous: 2.7 mmol/L (ref 0.5–1.9)

## 2022-01-22 LAB — CBG MONITORING, ED: Glucose-Capillary: 197 mg/dL — ABNORMAL HIGH (ref 70–99)

## 2022-01-22 LAB — PROCALCITONIN: Procalcitonin: 3.97 ng/mL

## 2022-01-22 MED ORDER — APIXABAN 5 MG PO TABS
5.0000 mg | ORAL_TABLET | Freq: Two times a day (BID) | ORAL | Status: DC
  Administered 2022-01-22 – 2022-01-26 (×8): 5 mg via ORAL
  Filled 2022-01-22 (×8): qty 1

## 2022-01-22 MED ORDER — SODIUM CHLORIDE 0.9 % IV SOLN
2.0000 g | INTRAVENOUS | Status: DC
  Administered 2022-01-23 – 2022-01-24 (×2): 2 g via INTRAVENOUS
  Filled 2022-01-22 (×2): qty 20

## 2022-01-22 MED ORDER — ACETAMINOPHEN 325 MG PO TABS
650.0000 mg | ORAL_TABLET | Freq: Four times a day (QID) | ORAL | Status: DC | PRN
  Administered 2022-01-23 – 2022-01-24 (×3): 650 mg via ORAL
  Filled 2022-01-22 (×3): qty 2

## 2022-01-22 MED ORDER — LACTATED RINGERS IV BOLUS
1000.0000 mL | Freq: Once | INTRAVENOUS | Status: AC
  Administered 2022-01-22: 1000 mL via INTRAVENOUS

## 2022-01-22 MED ORDER — SODIUM CHLORIDE 0.9% FLUSH
3.0000 mL | Freq: Two times a day (BID) | INTRAVENOUS | Status: DC
Start: 2022-01-22 — End: 2022-01-26
  Administered 2022-01-23 – 2022-01-26 (×6): 3 mL via INTRAVENOUS

## 2022-01-22 MED ORDER — ONDANSETRON HCL 4 MG PO TABS
4.0000 mg | ORAL_TABLET | Freq: Four times a day (QID) | ORAL | Status: DC | PRN
  Administered 2022-01-24: 4 mg via ORAL
  Filled 2022-01-22: qty 1

## 2022-01-22 MED ORDER — SODIUM CHLORIDE 0.9 % IV SOLN
500.0000 mg | INTRAVENOUS | Status: DC
  Administered 2022-01-22: 500 mg via INTRAVENOUS
  Filled 2022-01-22: qty 5

## 2022-01-22 MED ORDER — HEPARIN SODIUM (PORCINE) 5000 UNIT/ML IJ SOLN: 5000.0000 [IU] | Freq: Three times a day (TID) | INTRAMUSCULAR | Status: DC

## 2022-01-22 MED ORDER — SENNOSIDES-DOCUSATE SODIUM 8.6-50 MG PO TABS: 1.0000 | ORAL_TABLET | Freq: Every evening | ORAL | Status: DC | PRN

## 2022-01-22 MED ORDER — ONDANSETRON HCL 4 MG/2ML IJ SOLN
4.0000 mg | Freq: Four times a day (QID) | INTRAMUSCULAR | Status: DC | PRN
  Administered 2022-01-23: 4 mg via INTRAVENOUS
  Filled 2022-01-22: qty 2

## 2022-01-22 MED ORDER — ACETAMINOPHEN 650 MG RE SUPP: 650.0000 mg | Freq: Four times a day (QID) | RECTAL | Status: DC | PRN

## 2022-01-22 MED ORDER — SODIUM CHLORIDE 0.9 % IV SOLN
1.0000 g | Freq: Once | INTRAVENOUS | Status: AC
  Administered 2022-01-22: 1 g via INTRAVENOUS
  Filled 2022-01-22: qty 10

## 2022-01-22 MED ORDER — AZITHROMYCIN 250 MG PO TABS
500.0000 mg | ORAL_TABLET | Freq: Every day | ORAL | Status: AC
  Administered 2022-01-23 – 2022-01-26 (×4): 500 mg via ORAL
  Filled 2022-01-22 (×4): qty 2

## 2022-01-22 NOTE — ED Notes (Signed)
Pt's O2 ranging 88-89% on RA while sleeping. Pt placed on 2lpm [place], O2 increased to 92%.

## 2022-01-22 NOTE — ED Notes (Signed)
Date and time results received: 01/22/22 2022 (use smartphrase ".now" to insert current time)  Test: lactic acid Critical Value: 2.7  Name of Provider Notified: Dr. Myna Hidalgo  Orders Received? Or Actions Taken?:  n/a

## 2022-01-22 NOTE — ED Notes (Signed)
MD aware of need for second set of blood cultures, OK to administer abx and fluids and attempt again later.

## 2022-01-22 NOTE — ED Triage Notes (Signed)
Patient brought in by wife after seeing oncologist today. Concerned for pneumonia on CT scan. Patients wife reports decreased appetite and increased confusion. Patient on active chemo for cancer.

## 2022-01-22 NOTE — ED Provider Notes (Signed)
[facility] DEPT Provider Note   CSN: [id number] Arrival date & time: 01/22/22  1642     History  Chief Complaint  Patient presents with   Abnormal Lab   Altered Mental Status    Mike Donaldson. is a 71 y.o. male.  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  Pt's wife said he has not been acting normally for the past few days.  He went to the [place] yesterday for a CT scan of his chest, abd, pelvis.  CT showed a possible pna in the RML and RLL as well as chronic metastatic changes.  Pt went to the [place] clinic today for follow up on the CT scan.  Due to his confusion, they told him to come to the ED.  Pt's wife said she had to get her son to help her get him dressed.  Pt denies any pain now.  CT results in the media section.       Home Medications Prior to Admission medications   Medication Sig Start Date End Date Taking? Authorizing Provider  amLODipine (NORVASC) 10 MG tablet Take 10 mg by mouth daily.    [provider]  atorvastatin (LIPITOR) 20 MG tablet Take 20 mg by mouth daily at 6 PM.    [provider]  Brinzolamide-Brimonidine 1-0.2 % SUSP Place 1 drop into both eyes 2 (two) times daily.    [provider]  hydrochlorothiazide (HYDRODIURIL) 25 MG tablet Take 25 mg by mouth daily. Hold for systolic blood pressure less than 100    [provider]  meloxicam (MOBIC) 7.5 MG tablet Take 7.5 mg by mouth daily as needed for pain.    [provider]  methocarbamol (ROBAXIN) 500 MG tablet Take 500 mg by mouth at bedtime as needed for muscle spasms.    [provider]  omeprazole (PRILOSEC) 20 MG capsule Take 20 mg by mouth daily.    [provider]  potassium chloride SA (K-DUR,KLOR-CON) 20 MEQ tablet Take 40 mEq by mouth daily.    [provider]       Allergies    Codeine    Review of Systems   Review of Systems  Neurological:  Positive for weakness.  All other systems reviewed and are negative.   Physical Exam Updated Vital Signs BP 98/74   Pulse 69   Temp 99.2 F (37.3 C) (Oral)   Resp 20   SpO2 92%  Physical Exam Vitals and nursing note reviewed.  Constitutional:      Appearance: Normal appearance.  HENT:     Head: Normocephalic and atraumatic.     Right Ear: External ear normal.     Left Ear: External ear normal.     Nose: Nose normal.     Mouth/Throat:     Mouth: Mucous membranes are dry.  Eyes:     Extraocular Movements: Extraocular movements intact.     Conjunctiva/sclera: Conjunctivae normal.     Pupils: Pupils are equal, round, and reactive to light.  Cardiovascular:     Rate and Rhythm: Tachycardia present. Rhythm irregular.     Pulses: Normal pulses.     Heart sounds: Normal heart sounds.  Pulmonary:     Effort: Pulmonary effort is normal.     Breath sounds: Normal breath sounds.  Abdominal:     General: Abdomen is  flat. Bowel sounds are normal.     Palpations: Abdomen is soft.  Musculoskeletal:        General: Normal range of motion.     Cervical back: Normal range of motion and neck supple.  Skin:    General: Skin is warm.     Capillary Refill: Capillary refill takes less than 2 seconds.  Neurological:     General: No focal deficit present.     Mental Status: He is alert and oriented to person, place, and time.  Psychiatric:        Mood and Affect: Mood normal.        Behavior: Behavior normal.     ED Results / Procedures / Treatments   Labs (all labs ordered are listed, but only abnormal results are displayed) Labs Reviewed  CBC WITH DIFFERENTIAL/PLATELET - Abnormal; Notable for the following components:      Result Value   WBC 11.8 (*)    RBC 3.17 (*)    Hemoglobin 9.4 (*)    HCT 27.6 (*)    All other components within normal limits  COMPREHENSIVE METABOLIC PANEL - Abnormal;  Notable for the following components:   Sodium 130 (*)    Chloride 93 (*)    Glucose, Bld 187 (*)    BUN 46 (*)    Creatinine, Ser 1.72 (*)    Calcium 8.6 (*)    Albumin 2.9 (*)    AST 48 (*)    GFR, Estimated 42 (*)    All other components within normal limits  CBG MONITORING, ED - Abnormal; Notable for the following components:   Glucose-Capillary 197 (*)    All other components within normal limits  RESP PANEL BY RT-PCR (FLU A&B, COVID) ARPGX2  CULTURE, BLOOD (ROUTINE X 2)  CULTURE, BLOOD (ROUTINE X 2)  AMMONIA  MAGNESIUM  LACTIC ACID, PLASMA  URINALYSIS, ROUTINE W REFLEX MICROSCOPIC  LACTIC ACID, PLASMA  PROCALCITONIN  PROCALCITONIN    EKG EKG Interpretation  Date/Time:  Thursday January 22 2022 16:57:22 EDT Ventricular Rate:  88 PR Interval:  157 QRS Duration: 88 QT Interval:  314 QTC Calculation: 380 R Axis:   1 Text Interpretation: Sinus tachycardia Supraventricular bigeminy Since last tracing rate faster Confirmed by Isla Pence [phone]) on 01/22/2022 5:56:56 PM  Radiology CT Head Wo Contrast  Result Date: 01/22/2022 CLINICAL DATA:  Ill status changes EXAM: CT HEAD WITHOUT CONTRAST TECHNIQUE: Contiguous axial images were obtained from the base of the skull through the vertex without intravenous contrast. RADIATION DOSE REDUCTION: This exam was performed according to the departmental dose-optimization program which includes automated exposure control, adjustment of the mA and/or kV according to patient size and/or use of iterative reconstruction technique. COMPARISON:  None Available. FINDINGS: Brain: No acute intracranial abnormality. Specifically, no hemorrhage, hydrocephalus, mass lesion, acute infarction, or significant intracranial injury. Vascular: No hyperdense vessel or unexpected calcification. Skull: No acute calvarial abnormality. Sinuses/Orbits: No acute findings Other: None IMPRESSION: No acute intracranial abnormality. Electronically Signed   By: Rolm Baptise M.D.   On: 01/22/2022 18:26   DG Chest Portable 1 View  Result Date: 01/22/2022 CLINICAL DATA:  Possible pneumonia seen on CT scan EXAM: PORTABLE CHEST 1 VIEW COMPARISON:  Chest radiograph done on 02/10/2018 and CT chest done on 02/12/2018 FINDINGS: Cardiac size is within normal limits. There are no signs of pulmonary edema. Tip of right IJ chest port is seen in superior vena cava. New linear patchy infiltrate is seen in right lower lung field.  Costophrenic angles are clear. There is no pneumothorax. IMPRESSION: New linear patchy infiltrate in right lower lung field may suggest atelectasis/pneumonia. Electronically Signed   By: Elmer Picker M.D.   On: 01/22/2022 18:07    Procedures Procedures    Medications Ordered in ED Medications  azithromycin (ZITHROMAX) 500 mg in sodium chloride 0.9 % 250 mL IVPB (500 mg Intravenous New Bag/Given 01/22/22 1844)  cefTRIAXone (ROCEPHIN) 1 g in sodium chloride 0.9 % 100 mL IVPB (0 g Intravenous Stopped 01/22/22 1843)    ED Course/ Medical Decision Making/ A&P                           Medical Decision Making Amount and/or Complexity of Data Reviewed Labs: ordered. Radiology: ordered.  Risk Decision regarding hospitalization.   This patient presents to the ED for concern of ams, this involves an extensive number of treatment options, and is a complaint that carries with it a high risk of complications and morbidity.  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  The pertinent results include:  cbc with wbc 11.8, hgb  9.4; cmp with na 130 and glucose 187, bun/cr elevated at 46/1.72 (last bun/cr in 2109 nl)   Imaging Studies ordered:  I ordered imaging studies including cxr and CT head I independently visualized and interpreted imaging which showed  CXR: IMPRESSION:  New linear patchy infiltrate in right lower lung field may suggest  atelectasis/pneumonia.  CT head: IMPRESSION:  No acute intracranial abnormality.   I agree with the radiologist interpretation   Cardiac Monitoring:  The patient was maintained on a cardiac monitor.  I personally viewed and interpreted the cardiac monitored which showed an underlying rhythm of: sinus tachy   Medicines ordered and prescription drug management:  I ordered medication including rocephin and zithromax  for pna  Reevaluation of the patient after these medicines showed that the patient improved I have reviewed the patients home medicines and have made adjustments as needed   Test Considered:  Ct head   Critical Interventions:  Iv abx   Consultations Obtained:  I requested consultation with the hospitalist (Dr. Myna Hidalgo),  and discussed lab and imaging findings as well as pertinent plan - he will admit  Problem List / ED Course:  CAP with metabolic encephalopathy:  pt given iv abx  AKI:  pt given ivfs   Reevaluation:  After the interventions noted above, I reevaluated the patient and found that they have :improved   Social Determinants of Health:  Lives at home with wife   Dispostion:  After consideration of the diagnostic results and the patients response to treatment, I feel that the patent would benefit from admission.          Final Clinical Impression(s) / ED Diagnoses Final diagnoses:  AKI (acute kidney injury) ([place])  Community acquired pneumonia of right lower lobe of lung  Acute metabolic encephalopathy  Dehydration    Rx / DC Orders ED Discharge Orders     None         Isla Pence, MD 01/22/22  1936

## 2022-01-22 NOTE — H&P (Addendum)
History and Physical    Mike Donaldson. UVO:[id number] DOB: [birth year] DOA: 01/22/2022  PCP: [facility]   Patient coming from: Home   Chief Complaint: Confusion, pneumonia on outpatient CT   HPI: Mike Donaldson. is a pleasant 71 y.o. male with medical history significant for cholangiocarcinoma, hypertension, and chronic pain, now presenting to the emergency department for evaluation of confusion and pneumonia on outpatient imaging.  Patient is accompanied by his wife who assists with the history.  Patient had been undergoing chemotherapy for cholangiocarcinoma, had CT performed yesterday to assess his progress, was reportedly noted to have progression in his disease despite chemotherapy, and also noted to have pneumonia.  He has had exertional dyspnea recently but denies any significant cough.  He was having nausea and nonbloody vomiting 2 days ago but this was better yesterday.  He seemed to be more somnolent than usual and mildly confused today per report of his wife.  He had an appointment to review the CT results and it was recommended that he go to the emergency department.  ED Course: Upon arrival to the ED, patient is found to be afebrile and saturating low to mid 90s on room air with stable blood pressure.  EKG features sinus rhythm with bigeminy.  Head CT negative for acute cardiopulmonary disease.  Atelectasis or pneumonia noted in the right lower lobe on x-ray.  Chemistry panel features a creatinine of 1.72 and albumin 2.9.  CBC notable for a leukocytosis to 11,800 and normocytic anemia with hemoglobin 9.9.  Lactic acid was 1.9 and then 2.7.  Patient was given IV fluids, Rocephin, and azithromycin in the ED.  Review of Systems:  All other systems reviewed and apart from HPI, are negative.  Past Medical History:  Diagnosis Date   Cancer ([place])    Hyperlipidemia    Hypertension     History reviewed. No pertinent surgical history.  Social History:   reports that he has  quit smoking. His smoking use included cigars. He has quit using smokeless tobacco. No history on file for alcohol use and drug use.  Allergies  Allergen Reactions   Codeine Anaphylaxis   Latanoprost Other (See Comments)    Other reaction(s): Headache Headache    Metoprolol Other (See Comments)    Other reaction(s): Bradycardia bradycardic    Penicillin G    Tamsulosin     Other reaction(s): Blurring of visual image, Dizziness    Family History  Problem Relation Age of Onset   Heart attack Mother    Heart attack Brother      Prior to Admission medications   Medication Sig Start Date End Date Taking? Authorizing Provider  amLODipine (NORVASC) 10 MG tablet Take 10 mg by mouth daily.   Yes [provider]  atorvastatin (LIPITOR) 20 MG tablet Take 20 mg by mouth every evening.   Yes [provider]  Brinzolamide-Brimonidine 1-0.2 % SUSP Place 1 drop into both eyes 2 (two) times daily.   Yes [provider]  Cholecalciferol (VITAMIN D3) 25 MCG (1000 UT) CAPS Take 1 capsule by mouth daily.   Yes [provider]  Cyanocobalamin (VITAMIN B-12) 5000 MCG TBDP Take by mouth.   Yes [provider]  DULoxetine (CYMBALTA) 20 MG capsule Take 20 mg by mouth daily.   Yes [provider]  hydrochlorothiazide (HYDRODIURIL) 25 MG tablet Take 12.5 mg by mouth daily. Hold for systolic blood pressure less than 100   Yes [provider]  meloxicam (  MOBIC) 7.5 MG tablet Take 7.5 mg by mouth daily as needed for pain.   Yes [provider]  methocarbamol (ROBAXIN) 500 MG tablet Take 500 mg by mouth at bedtime as needed for muscle spasms.   Yes [provider]  morphine (MSIR) 15 MG tablet Take 1 tablet by mouth every 12 (twelve) hours.   Yes [provider]  omeprazole (PRILOSEC) 20 MG capsule Take 20 mg by mouth daily.   Yes [provider]  ondansetron (ZOFRAN) 4 MG tablet Take 4 mg by mouth every 8  (eight) hours as needed for nausea or vomiting.   Yes [provider]  oxyCODONE (OXY IR/ROXICODONE) 5 MG immediate release tablet Take 5 mg by mouth every 6 (six) hours as needed for moderate pain. 05/19/21  Yes [provider]  potassium chloride SA (K-DUR,KLOR-CON) 20 MEQ tablet Take 40 mEq by mouth daily.   Yes [provider]  vitamin C (ASCORBIC ACID) 500 MG tablet Take 500 mg by mouth daily.   Yes [provider]    Physical Exam: Vitals:   01/22/22 2310 01/22/22 2330 01/23/22 0000 01/23/22 0030  BP:  117/74 135/86 130/66  Pulse: (!) 50 (!) 57 67 71  Resp: 19 20 (!) 23 20  Temp:      TempSrc:      SpO2: 92% 95% 93% 93%    Constitutional: NAD, calm  Eyes: PERTLA, lids and conjunctivae normal ENMT: Mucous membranes are moist. Posterior pharynx clear of any exudate or lesions.   Neck: supple, no masses  Respiratory:  no wheezing, no crackles. No accessory muscle use.  Cardiovascular: S1 & S2 heard, regular rate and rhythm. No extremity edema.   Abdomen: No distension, no tenderness, soft. Bowel sounds active.  Musculoskeletal: no clubbing / cyanosis. No joint deformity upper and lower extremities.   Skin: no significant rashes, lesions, ulcers. Warm, dry, well-perfused. Neurologic: CN 2-12 grossly intact. Moving all extremities. Alert and oriented to person, place, and situation.  Psychiatric: Pleasant. Cooperative.    Labs and Imaging on Admission: I have personally reviewed following labs and imaging studies  CBC: Recent Labs  Lab 01/22/22 1743  WBC 11.8*  NEUTROABS 9.4*  HGB 9.4*  HCT 27.6*  MCV 87.1  PLT 628   Basic Metabolic Panel: Recent Labs  Lab 01/22/22 1743  NA 130*  K 3.5  CL 93*  CO2 26  GLUCOSE 187*  BUN 46*  CREATININE 1.72*  CALCIUM 8.6*  MG 1.7   GFR: CrCl cannot be calculated (Unknown ideal weight.). Liver Function Tests: Recent Labs  Lab 01/22/22 1743  AST 48*  ALT 30  ALKPHOS 111  BILITOT 0.7   PROT 6.5  ALBUMIN 2.9*   No results for input(s): "LIPASE", "AMYLASE" in the last 168 hours. Recent Labs  Lab 01/22/22 1743  AMMONIA 17   Coagulation Profile: No results for input(s): "INR", "PROTIME" in the last 168 hours. Cardiac Enzymes: No results for input(s): "CKTOTAL", "CKMB", "CKMBINDEX", "TROPONINI" in the last 168 hours. BNP (last 3 results) No results for input(s): "PROBNP" in the last 8760 hours. HbA1C: No results for input(s): "HGBA1C" in the last 72 hours. CBG: Recent Labs  Lab 01/22/22 1737  GLUCAP 197*   Lipid Profile: No results for input(s): "CHOL", "HDL", "LDLCALC", "TRIG", "CHOLHDL", "LDLDIRECT" in the last 72 hours. Thyroid Function Tests: Recent Labs    01/22/22 2356  TSH 2.006   Anemia Panel: No results for input(s): "VITAMINB12", "FOLATE", "FERRITIN", "TIBC", "IRON", "RETICCTPCT" in the  last 72 hours. Urine analysis: No results found for: "COLORURINE", "APPEARANCEUR", "LABSPEC", "PHURINE", "GLUCOSEU", "HGBUR", "BILIRUBINUR", "KETONESUR", "PROTEINUR", "UROBILINOGEN", "NITRITE", "LEUKOCYTESUR" Sepsis Labs: '@LABRCNTIP'$ (procalcitonin:4,lacticidven:4) ) Recent Results (from the past 240 hour(s))  Resp Panel by RT-PCR (Flu A&B, Covid) Anterior Nasal Swab     Status: None   Collection Time: 01/22/22  5:42 PM   Specimen: Anterior Nasal Swab  Result Value Ref Range Status   SARS Coronavirus 2 by RT PCR NEGATIVE NEGATIVE Final    Comment: (NOTE) SARS-CoV-2 target nucleic acids are NOT DETECTED.  The SARS-CoV-2 RNA is generally detectable in upper respiratory specimens during the acute phase of infection. The lowest concentration of SARS-CoV-2 viral copies this assay can detect is 138 copies/mL. A negative result does not preclude SARS-Cov-2 infection and should not be used as the sole basis for treatment or other patient management decisions. A negative result may occur with  improper specimen collection/handling, submission of specimen other than  nasopharyngeal swab, presence of viral mutation(s) within the areas targeted by this assay, and inadequate number of viral copies(<138 copies/mL). A negative result must be combined with clinical observations, patient history, and epidemiological information. The expected result is Negative.  Fact Sheet for Patients:  EntrepreneurPulse.com.au  Fact Sheet for Healthcare Providers:  IncredibleEmployment.be  This test is no t yet approved or cleared by the [place] FDA and  has been authorized for detection and/or diagnosis of SARS-CoV-2 by FDA under an Emergency Use Authorization (EUA). This EUA will remain  in effect (meaning this test can be used) for the duration of the COVID-19 declaration under Section 564(b)(1) of the Act, 21 U.S.C.section 360bbb-3(b)(1), unless the authorization is terminated  or revoked sooner.       Influenza A by PCR NEGATIVE NEGATIVE Final   Influenza B by PCR NEGATIVE NEGATIVE Final    Comment: (NOTE) The Xpert Xpress SARS-CoV-2/FLU/RSV plus assay is intended as an aid in the diagnosis of influenza from Nasopharyngeal swab specimens and should not be used as a sole basis for treatment. Nasal washings and aspirates are unacceptable for Xpert Xpress SARS-CoV-2/FLU/RSV testing.  Fact Sheet for Patients: EntrepreneurPulse.com.au  Fact Sheet for Healthcare Providers: IncredibleEmployment.be  This test is not yet approved or cleared by the [place] FDA and has been authorized for detection and/or diagnosis of SARS-CoV-2 by FDA under an Emergency Use Authorization (EUA). This EUA will remain in effect (meaning this test can be used) for the duration of the COVID-19 declaration under Section 564(b)(1) of the Act, 21 U.S.C. section 360bbb-3(b)(1), unless the authorization is terminated or revoked.  Performed at [facility], [place] [address] [place], [place] [address]      Radiological Exams on Admission: CT Head Wo Contrast  Result Date: 01/22/2022 CLINICAL DATA:  Ill status changes EXAM: CT HEAD WITHOUT CONTRAST TECHNIQUE: Contiguous axial images were obtained from the base of the skull through the vertex without intravenous contrast. RADIATION DOSE REDUCTION: This exam was performed according to the departmental dose-optimization program which includes automated exposure control, adjustment of the mA and/or kV according to patient size and/or use of iterative reconstruction technique. COMPARISON:  None Available. FINDINGS: Brain: No acute intracranial abnormality. Specifically, no hemorrhage, hydrocephalus, mass lesion, acute infarction, or significant intracranial injury. Vascular: No hyperdense vessel or unexpected calcification. Skull: No acute calvarial abnormality. Sinuses/Orbits: No acute findings Other: None IMPRESSION: No acute intracranial abnormality. Electronically Signed   By: Rolm Baptise M.D.   On: 01/22/2022 18:26   DG Chest Portable 1 View  Result Date: 01/22/2022 CLINICAL DATA:  Possible pneumonia seen on CT scan EXAM: PORTABLE CHEST 1 VIEW COMPARISON:  Chest radiograph done on 02/10/2018 and CT chest done on 02/12/2018 FINDINGS: Cardiac size is within normal limits. There are no signs of pulmonary edema. Tip of right IJ chest port is seen in superior vena cava. New linear patchy infiltrate is seen in right lower lung field. Costophrenic angles are clear. There is no pneumothorax. IMPRESSION: New linear patchy infiltrate in right lower lung field may suggest atelectasis/pneumonia. Electronically Signed   By: Elmer Picker M.D.   On: 01/22/2022 18:07    EKG: Independently reviewed. Sinus rhythm, PVCs.   Assessment/Plan   ADDENDUM (01:20): Pt had been in SR with frequent ectopy, now appears to be in rapid a fib with stable BP. He has alert listed for hx of bradycardia with metoprolol; EF was normal on TTE from  2019, will plan to give diltiazem bolus and start infusion if needed.    1. Pneumonia  - Presents with confusion and pneumonia on outpatient CT  - Not septic on admission  - Started on Rocephin and azithromycin in ED  - Check strep pneumo and legionella antigens, trend procalcitonin, continue current antibiotics   2. AKI  - SCr is 1.72 on admission, up from 1.2 in June 2023  - Likely prerenal in setting of recent N/V  - Continue IVF hydration, renally-dose medications, repeat chem panel    3. Acute encephalopathy  - Noted by pt's wife to be confused the day of admission  - No acute findings on head CT; TSH and ammonia normal in ED  - Likely from acute infection and accumulating morphine metabolites in setting of AKI  - Treat infection and AKI, hold morphine   4. Hx of PE  - Continue Eliquis   5. Chronic pain  - No pain complaints on admission  - Prescription database reviewed  - Hold morphine initially given decreased GFR, continue oxycodone, Robaxin, and Cymbalta    6. Cholangiocarcinoma  - [facility]  - Pt reports CT from 01/21/22 unfortunately demonstrates progression despite chemo    7. Hypertension  - Continue Norvasc    DVT prophylaxis: Eliquis  Code Status: Full, discussed with patient and his wife on admission  Level of Care: Level of care: Telemetry Family Communication: Wife at bedside  Disposition Plan:  Patient is from: home  Anticipated d/c is to: TBD Anticipated d/c date is: 01/25/22  Patient currently: pending improved renal function and mental status, transition to oral antibiotic Consults called: none Admission status: Inpatient     Vianne Bulls, MD [facility]  01/23/2022, 12:59 AM

## 2022-01-23 DIAGNOSIS — G8929 Other chronic pain: Secondary | ICD-10-CM | POA: Diagnosis present

## 2022-01-23 DIAGNOSIS — J189 Pneumonia, unspecified organism: Secondary | ICD-10-CM | POA: Diagnosis not present

## 2022-01-23 DIAGNOSIS — C221 Intrahepatic bile duct carcinoma: Secondary | ICD-10-CM | POA: Insufficient documentation

## 2022-01-23 LAB — BASIC METABOLIC PANEL
Anion gap: 13 (ref 5–15)
BUN: 43 mg/dL — ABNORMAL HIGH (ref 8–23)
CO2: 25 mmol/L (ref 22–32)
Calcium: 8.6 mg/dL — ABNORMAL LOW (ref 8.9–10.3)
Chloride: 96 mmol/L — ABNORMAL LOW (ref 98–111)
Creatinine, Ser: 1.32 mg/dL — ABNORMAL HIGH (ref 0.61–1.24)
GFR, Estimated: 58 mL/min — ABNORMAL LOW (ref >60)
Glucose, Bld: 137 mg/dL — ABNORMAL HIGH (ref 70–99)
Potassium: 2.9 mmol/L — ABNORMAL LOW (ref 3.5–5.1)
Sodium: 134 mmol/L — ABNORMAL LOW (ref 135–145)

## 2022-01-23 LAB — CBC
HCT: 25.9 % — ABNORMAL LOW (ref 39.0–52.0)
Hemoglobin: 8.9 g/dL — ABNORMAL LOW (ref 13.0–17.0)
MCH: 29.8 pg (ref 26.0–34.0)
MCHC: 34.4 g/dL (ref 30.0–36.0)
MCV: 86.6 fL (ref 80.0–100.0)
Platelets: 319 10*3/uL (ref 150–400)
RBC: 2.99 MIL/uL — ABNORMAL LOW (ref 4.22–5.81)
RDW: 15.2 % (ref 11.5–15.5)
WBC: 9.7 10*3/uL (ref 4.0–10.5)
nRBC: 0 % (ref 0.0–0.2)

## 2022-01-23 LAB — URINALYSIS, ROUTINE W REFLEX MICROSCOPIC
Bacteria, UA: NONE SEEN
Bilirubin Urine: NEGATIVE
Glucose, UA: NEGATIVE mg/dL
Hgb urine dipstick: NEGATIVE
Ketones, ur: NEGATIVE mg/dL
Leukocytes,Ua: NEGATIVE
Nitrite: NEGATIVE
Protein, ur: 30 mg/dL — AB
Specific Gravity, Urine: 1.025 (ref 1.005–1.030)
pH: 5 (ref 5.0–8.0)

## 2022-01-23 LAB — RPR: RPR Ser Ql: NONREACTIVE

## 2022-01-23 LAB — AMMONIA: Ammonia: 26 umol/L (ref 9–35)

## 2022-01-23 LAB — LACTIC ACID, PLASMA: Lactic Acid, Venous: 1.4 mmol/L (ref 0.5–1.9)

## 2022-01-23 LAB — MRSA NEXT GEN BY PCR, NASAL: MRSA by PCR Next Gen: NOT DETECTED

## 2022-01-23 LAB — TSH: TSH: 2.006 u[IU]/mL (ref 0.350–4.500)

## 2022-01-23 LAB — PROCALCITONIN: Procalcitonin: 5.25 ng/mL

## 2022-01-23 LAB — CBG MONITORING, ED: Glucose-Capillary: 164 mg/dL — ABNORMAL HIGH (ref 70–99)

## 2022-01-23 LAB — VITAMIN B12: Vitamin B-12: 4569 pg/mL — ABNORMAL HIGH (ref 180–914)

## 2022-01-23 LAB — STREP PNEUMONIAE URINARY ANTIGEN: Strep Pneumo Urinary Antigen: NEGATIVE

## 2022-01-23 MED ORDER — DILTIAZEM HCL 25 MG/5ML IV SOLN
15.0000 mg | Freq: Once | INTRAVENOUS | Status: AC
  Administered 2022-01-23: 15 mg via INTRAVENOUS
  Filled 2022-01-23: qty 5

## 2022-01-23 MED ORDER — ATORVASTATIN CALCIUM 10 MG PO TABS
20.0000 mg | ORAL_TABLET | Freq: Every evening | ORAL | Status: DC
  Administered 2022-01-23 – 2022-01-25 (×3): 20 mg via ORAL
  Filled 2022-01-23 (×3): qty 2

## 2022-01-23 MED ORDER — BRINZOLAMIDE 1 % OP SUSP
1.0000 [drp] | Freq: Three times a day (TID) | OPHTHALMIC | Status: DC
Start: 2022-01-23 — End: 2022-01-26
  Administered 2022-01-23 – 2022-01-26 (×8): 1 [drp] via OPHTHALMIC
  Filled 2022-01-23: qty 10

## 2022-01-23 MED ORDER — AMLODIPINE BESYLATE 5 MG PO TABS
10.0000 mg | ORAL_TABLET | Freq: Every day | ORAL | Status: DC
Start: 2022-01-23 — End: 2022-01-23

## 2022-01-23 MED ORDER — METHOCARBAMOL 500 MG PO TABS: 500.0000 mg | ORAL_TABLET | Freq: Every evening | ORAL | Status: DC | PRN

## 2022-01-23 MED ORDER — SODIUM CHLORIDE 0.9 % IV SOLN: INTRAVENOUS | Status: AC

## 2022-01-23 MED ORDER — ORAL CARE MOUTH RINSE
15.0000 mL | OROMUCOSAL | Status: DC
  Administered 2022-01-23 – 2022-01-26 (×10): 15 mL via OROMUCOSAL

## 2022-01-23 MED ORDER — POLYETHYLENE GLYCOL 3350 17 G PO PACK
17.0000 g | PACK | Freq: Every day | ORAL | Status: DC | PRN
  Administered 2022-01-23: 17 g via ORAL
  Filled 2022-01-23: qty 1

## 2022-01-23 MED ORDER — OXYCODONE HCL 5 MG PO TABS: 5.0000 mg | ORAL_TABLET | Freq: Four times a day (QID) | ORAL | Status: DC | PRN

## 2022-01-23 MED ORDER — LACTATED RINGERS IV SOLN: INTRAVENOUS | Status: DC

## 2022-01-23 MED ORDER — CHLORHEXIDINE GLUCONATE CLOTH 2 % EX PADS
6.0000 | MEDICATED_PAD | Freq: Every day | CUTANEOUS | Status: DC
  Administered 2022-01-23 – 2022-01-26 (×4): 6 via TOPICAL

## 2022-01-23 MED ORDER — MORPHINE SULFATE ER 15 MG PO TBCR
15.0000 mg | EXTENDED_RELEASE_TABLET | Freq: Two times a day (BID) | ORAL | Status: DC
  Administered 2022-01-23 – 2022-01-26 (×7): 15 mg via ORAL
  Filled 2022-01-23 (×7): qty 1

## 2022-01-23 MED ORDER — DILTIAZEM HCL-DEXTROSE 125-5 MG/125ML-% IV SOLN (PREMIX)
5.0000 mg/h | INTRAVENOUS | Status: DC
  Administered 2022-01-23: 5 mg/h via INTRAVENOUS
  Filled 2022-01-23: qty 125

## 2022-01-23 MED ORDER — DILTIAZEM HCL-DEXTROSE 125-5 MG/125ML-% IV SOLN (PREMIX)
5.0000 mg/h | INTRAVENOUS | Status: DC
  Administered 2022-01-23: 5 mg/h via INTRAVENOUS

## 2022-01-23 MED ORDER — ORAL CARE MOUTH RINSE
15.0000 mL | OROMUCOSAL | Status: DC | PRN
Start: 2022-01-23 — End: 2022-01-26

## 2022-01-23 MED ORDER — DULOXETINE HCL 20 MG PO CPEP
20.0000 mg | ORAL_CAPSULE | Freq: Every day | ORAL | Status: DC
  Administered 2022-01-23 – 2022-01-26 (×3): 20 mg via ORAL
  Filled 2022-01-23 (×4): qty 1

## 2022-01-23 MED ORDER — DILTIAZEM HCL-DEXTROSE 125-5 MG/125ML-% IV SOLN (PREMIX): 5.0000 mg/h | INTRAVENOUS | Status: DC

## 2022-01-23 MED ORDER — BISACODYL 10 MG RE SUPP
10.0000 mg | Freq: Once | RECTAL | Status: DC
  Filled 2022-01-23: qty 1

## 2022-01-23 MED ORDER — PANTOPRAZOLE SODIUM 40 MG PO TBEC
40.0000 mg | DELAYED_RELEASE_TABLET | Freq: Every day | ORAL | Status: DC
  Administered 2022-01-23 – 2022-01-26 (×4): 40 mg via ORAL
  Filled 2022-01-23 (×4): qty 1

## 2022-01-23 MED ORDER — POTASSIUM CHLORIDE CRYS ER 20 MEQ PO TBCR
40.0000 meq | EXTENDED_RELEASE_TABLET | ORAL | Status: AC
  Administered 2022-01-23 (×2): 40 meq via ORAL
  Filled 2022-01-23 (×2): qty 2

## 2022-01-23 NOTE — ED Notes (Signed)
Bladder scanned pt, pt retains 622 mL of urine in bladder.

## 2022-01-23 NOTE — Progress Notes (Signed)
PROGRESS NOTE  Mike Donaldson.  DOB: 1950/12/19  PCP: [place] KKX:[id number]  DOA: 01/22/2022  LOS: 1 day  Hospital Day: 2  Brief narrative: Mike Donaldson. is a 71 y.o. male with PMH significant for HTN, cholangiocarcinoma, chronic pain. Patient presented to the ED on 7/13 for evaluation of confusion and mention of pneumonia noted in outpatient imaging.  For cholangiocarcinoma, patient follows up with oncologist at [facility].  Diagnosed in November 2022 and has been on chemotherapy since then.  Completed 8 cycles of chemotherapy in June. For the last 1 to 2 weeks, patient has poor oral intake, progressively worsening weakness, constipation and he also developed confusion and altered mentation. He followed up with his oncologist.  CT scan was performed on 7/12 to look for progression of disease.  One of the findings reading was pneumonia.  Patient endorsed symptoms of exertional dyspnea, generalized weakness, somnolence and confusion and hence he was directed to the ED.  In the ED, patient initially had a low temperature of 99.2, heart rate 68, blood pressure 124/72, breathing on room air Labs showed WC count elevated to 11.8, hemoglobin low at 9.4, sodium level low at 130, glucose elevated 187, BUN/creatinine elevated to 46/1.72, initial lactic acid was low at 1.9 After several hours in the ED, patient spiked a fever of 101 and lactic acid trended up to 2.7, procalcitonin at 3.97 Chest x-ray showed a new linear patchy infiltrate in the right lower lung suggestive of pneumonia CT head negative for acute intracranial abnormality Patient was started on IV antibiotics, IV fluid and admitted to hospitalist service  Subjective: Patient was seen and examined this morning.  Pleasant elderly Caucasian male.  Lying down in bed.  Alert, awake, oriented to place and person.  Wife at bedside.  Patient is not in acute distress at this time.  On low-flow oxygen. It seems sometimes last night,  patient's heart rate climbed up to 180s and he was started on Cardizem drip currently running at 5 mg per hour. Chart reviewed  Assessment and plan: Right lower lobe pneumonia pneumonia  Sepsis secondary to pneumonia - POA -Chemotherapy patient presented with shortness of breath, lethargy, developed fever of 101 in the ED with WBC count and lactic acid elevated  -Chest x-ray with right lower lobe infiltrate  -Blood culture sent. -Currently on IV Rocephin, azithromycin. -Check strep pneumo and legionella antigens, trend procalcitonin, continue current antibiotics  Recent Labs  Lab 01/22/22 1743 01/22/22 1746 01/22/22 1954 01/22/22 2316 01/23/22 0437  WBC 11.8*  --   --   --  9.7  LATICACIDVEN  --  1.9 2.7* 1.4  --   PROCALCITON  --   --  3.97  --  5.25   A-fib with RVR -New onset A-fib last night in the setting of sepsis, severe hypokalemia.  Heart rate as high as 180s.  EKG reviewed.   -Patient was started on Cardizem drip.  Heart rate has been stable for last several hours.  Okay to stop Cardizem drip for now.  Continue to monitor in telemetry -PTA, not on any AV nodal blocking agent.  AKI  -Baseline creatinine 1.2 from June 2023.  Patient presented with creatinine elevated 1.72.  Likely prerenal related to sepsis.   -Continue to monitor on IV fluid.   Recent Labs    01/22/22 1743 01/23/22 0437  BUN 46* 43*  CREATININE 1.72* 1.32*   Acute encephalopathy  -Confusion likely related to sepsis, AKI in the setting of  physical and mental decline related to cancer and chemotherapy.   -Minimize use of mood altering medications. -Mental status gradually improving.  Able to follow commands, oriented to place and person, not to time.  Essential hypertension -PTA on amlodipine 10 mg daily, HCTZ 12.5 mg daily.  -Continue amlodipine.  Keep HCTZ on hold.  Continue to monitor blood pressure.   Hyperlipidemia -Continue Lipitor   Hx of PE  - Continue Eliquis    Chronic pain  -PTA  on morphine 15 mg twice daily, Robaxin, Cymbalta, oxycodone PRN, meloxicam -Avoid meloxicam because of AKI.  Resume other meds.  Monitor mental status   Cholangiocarcinoma  -patient follows up with oncologist at [facility].  Diagnosed in November 2022 and has been on chemotherapy since then.  Completed 8 cycles of chemotherapy in June. -Patient's wife reports CT from 01/21/22 unfortunately demonstrates progression despite chemotherapy.  To follow-up with oncology at [facility] outpatient  Constipation -No BM in several days.  Started on Senokot scheduled, MiraLAX as needed.  1 dose of Dulcolax today.   Goals of care   Code Status: Full Code    Mobility: Was functional 2 weeks ago.  Obtain PT eval  Skin assessment:     Nutritional status:  There is no height or weight on file to calculate BMI.          Diet:  Diet Order             Diet regular Room service appropriate? Yes; Fluid consistency: Thin  Diet effective now                   DVT prophylaxis:   apixaban (ELIQUIS) tablet 5 mg   Antimicrobials: IV Rocephin, azithromycin Fluid: NS at 100 mill per hour Consultants: None Family Communication: Wife at bedside  Status is: Inpatient  Continue in-hospital care because: IV antibiotics, IV fluid, PT eval pending Level of care: Progressive   Dispo: The patient is from: Home              Anticipated d/c is to: Pending clinical course              Patient currently is not medically stable to d/c.   Difficult to place patient No     Infusions:   cefTRIAXone (ROCEPHIN)  IV      Scheduled Meds:  apixaban  5 mg Oral BID   atorvastatin  20 mg Oral QPM   azithromycin  500 mg Oral Daily   bisacodyl  10 mg Rectal Once   Chlorhexidine Gluconate Cloth  6 each Topical Daily   DULoxetine  20 mg Oral Daily   morphine  15 mg Oral Q12H   mouth rinse  15 mL Mouth Rinse 4 times per day   pantoprazole  40 mg Oral Daily   potassium chloride  40 mEq Oral Q4H   sodium chloride flush   3 mL Intravenous Q12H    PRN meds: acetaminophen **OR** acetaminophen, methocarbamol, ondansetron **OR** ondansetron (ZOFRAN) IV, mouth rinse, oxyCODONE, polyethylene glycol, senna-docusate   Antimicrobials: Anti-infectives (From admission, onward)    Start     Dose/Rate Route Frequency Ordered Stop   01/23/22 1730  cefTRIAXone (ROCEPHIN) 2 g in sodium chloride 0.9 % 100 mL IVPB        2 g 200 mL/hr over 30 Minutes Intravenous Every 24 hours 01/22/22 2057 01/27/22 1729   01/23/22 1730  azithromycin (ZITHROMAX) tablet 500 mg        500 mg Oral Daily  01/22/22 2057 01/27/22 0959   01/22/22 1730  cefTRIAXone (ROCEPHIN) 1 g in sodium chloride 0.9 % 100 mL IVPB        1 g 200 mL/hr over 30 Minutes Intravenous  Once 01/22/22 1721 01/22/22 1843   01/22/22 1730  azithromycin (ZITHROMAX) 500 mg in sodium chloride 0.9 % 250 mL IVPB  Status:  Discontinued        500 mg 250 mL/hr over 60 Minutes Intravenous Every 24 hours 01/22/22 1721 01/22/22 2059       Objective: Vitals:   01/23/22 1228 01/23/22 1300  BP:  (!) 108/46  Pulse:  84  Resp:  18  Temp: 97.7 F (36.5 C)   SpO2:  95%    Intake/Output Summary (Last 24 hours) at 01/23/2022 1306 Last data filed at 01/23/2022 0934 Gross per 24 hour  Intake 134.26 ml  Output 800 ml  Net -665.74 ml   There were no vitals filed for this visit. Weight change:  There is no height or weight on file to calculate BMI.   Physical Exam: General exam: Pleasant, elderly Caucasian male.  Not in physical distress Skin: No rashes, lesions or ulcers. HEENT: Atraumatic, normocephalic, no obvious bleeding Lungs: Diminished air entry in both bases, otherwise clear to auscultation bilaterally CVS: Regular rate and rhythm, no murmur GI/Abd soft, nontender, nondistended, bowel sound present CNS: Alert, awake, oriented to place and person, not to time Psychiatry: Mood appropriate Extremities: No pedal edema, no calf tenderness  Data Review: I have  personally reviewed the laboratory data and studies available.  F/u labs ordered Unresulted Labs (From admission, onward)     Start     Ordered   01/23/22 1302  MRSA Next Gen by PCR, Nasal  Once,   R        01/23/22 1302   01/23/22 0500  Procalcitonin  Daily at 5am,   R      01/22/22 1932   01/23/22 5102  Basic metabolic panel  Daily at 5am,   R      01/22/22 2057   01/23/22 0500  CBC  Daily at 5am,   R      01/22/22 2057   01/22/22 2056  Legionella Pneumophila Serogp 1 Ur Ag  (COPD / Pneumonia / Cellulitis / Lower Extremity Wound)  Once,   R        01/22/22 2057   01/22/22 2056  Expectorated Sputum Assessment w Gram Stain, Rflx to Resp Cult  (COPD / Pneumonia / Cellulitis / Lower Extremity Wound)  Once,   R        01/22/22 2057   01/22/22 1711  Culture, blood (routine x 2)  BLOOD CULTURE X 2,   R (with STAT occurrences)      01/22/22 1710            Signed, Terrilee Croak, MD [facility] 01/23/2022

## 2022-01-23 NOTE — ED Notes (Signed)
Pt bladder scan 689m- attending BMarlowe AschoffMD notified awaiting orders.

## 2022-01-23 NOTE — Progress Notes (Signed)
Pt refused cpap, said he does not use his.

## 2022-01-23 NOTE — ED Notes (Signed)
Hospitalist at bedside 

## 2022-01-24 ENCOUNTER — Other Ambulatory Visit (HOSPITAL_COMMUNITY): Payer: Medicare Other

## 2022-01-24 DIAGNOSIS — J189 Pneumonia, unspecified organism: Secondary | ICD-10-CM | POA: Diagnosis not present

## 2022-01-24 LAB — CBC
HCT: 24.6 % — ABNORMAL LOW (ref 39.0–52.0)
Hemoglobin: 8.2 g/dL — ABNORMAL LOW (ref 13.0–17.0)
MCH: 29.7 pg (ref 26.0–34.0)
MCHC: 33.3 g/dL (ref 30.0–36.0)
MCV: 89.1 fL (ref 80.0–100.0)
Platelets: 298 10*3/uL (ref 150–400)
RBC: 2.76 MIL/uL — ABNORMAL LOW (ref 4.22–5.81)
RDW: 15.6 % — ABNORMAL HIGH (ref 11.5–15.5)
WBC: 12.5 10*3/uL — ABNORMAL HIGH (ref 4.0–10.5)
nRBC: 0 % (ref 0.0–0.2)

## 2022-01-24 LAB — BASIC METABOLIC PANEL
Anion gap: 11 (ref 5–15)
BUN: 40 mg/dL — ABNORMAL HIGH (ref 8–23)
CO2: 24 mmol/L (ref 22–32)
Calcium: 8.5 mg/dL — ABNORMAL LOW (ref 8.9–10.3)
Chloride: 99 mmol/L (ref 98–111)
Creatinine, Ser: 1.21 mg/dL (ref 0.61–1.24)
GFR, Estimated: >60 mL/min (ref >60)
Glucose, Bld: 170 mg/dL — ABNORMAL HIGH (ref 70–99)
Potassium: 3.4 mmol/L — ABNORMAL LOW (ref 3.5–5.1)
Sodium: 134 mmol/L — ABNORMAL LOW (ref 135–145)

## 2022-01-24 LAB — BLOOD CULTURE ID PANEL (REFLEXED) - BCID2

## 2022-01-24 LAB — PROCALCITONIN: Procalcitonin: 10.79 ng/mL

## 2022-01-24 MED ORDER — POTASSIUM CHLORIDE CRYS ER 20 MEQ PO TBCR
40.0000 meq | EXTENDED_RELEASE_TABLET | Freq: Once | ORAL | Status: AC
  Administered 2022-01-24: 40 meq via ORAL
  Filled 2022-01-24: qty 2

## 2022-01-24 MED ORDER — BISACODYL 10 MG RE SUPP
10.0000 mg | Freq: Every day | RECTAL | Status: DC | PRN
  Administered 2022-01-24: 10 mg via RECTAL
  Filled 2022-01-24: qty 1

## 2022-01-24 NOTE — Evaluation (Signed)
Physical Therapy Evaluation Patient Details Name: Mike Donaldson. 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  Clinical Impression  Patient resting in be, eager to ambulate. Patient ambulated x 200' with Rw and supervision. Wife present. SPO2 99% on RA. Patient should progress to return home. Patient has  DME if needed.  Pt admitted with above diagnosis.  Pt currently with functional limitations due to the deficits listed below (see PT Problem List). Pt will benefit from skilled PT to increase their independence and safety with mobility to allow discharge to the venue listed below.          Recommendations for follow up therapy are one component of a multi-disciplinary discharge planning process, led by the attending physician.  Recommendations may be updated based on patient status, additional functional criteria and insurance authorization.  Follow Up Recommendations No PT follow up      Assistance Recommended at Discharge Set up Supervision/Assistance  Patient can return home with the following  Help with stairs or ramp for entrance;Assistance with cooking/housework;Assist for transportation;A little help with bathing/dressing/bathroom    Equipment Recommendations None recommended by PT  Recommendations for Other Services       Functional Status Assessment Patient has had a recent decline in their functional status and demonstrates the ability to make significant improvements in function in a reasonable and predictable amount of time.     Precautions / Restrictions Precautions Precautions: Fall      Mobility  Bed  Mobility Overal bed mobility: Modified Independent                  Transfers Overall transfer level: Needs assistance Equipment used: Rolling walker (2 wheels) Transfers: Sit to/from Stand Sit to Stand: Supervision                Ambulation/Gait Ambulation/Gait assistance: Supervision Gait Distance (Feet): 220 Feet Assistive device: Rolling walker (2 wheels) Gait Pattern/deviations: Step-through pattern Gait velocity: decr     General Gait Details: no balance loss with RW  Stairs            Wheelchair Mobility    Modified Rankin (Stroke Patients Only)       Balance Overall balance assessment: Needs assistance   Sitting balance-Leahy Scale: Normal     Standing balance support: No upper extremity supported, During functional activity Standing balance-Leahy Scale: Fair                               Pertinent Vitals/Pain Pain Assessment Pain Assessment: No/denies pain    Home Living Family/patient expects to be discharged to:: Private residence Living Arrangements: Spouse/significant other Available Help at Discharge: Family;Available 24 hours/day Type of Home: House Home Access: Stairs to enter   Entrance Stairs-Number of Steps: 1 Alternate Level Stairs-Number of Steps: 1 Home Layout: Multi-level Home Equipment: Rollator (4 wheels);Rolling Walker (2 wheels);BSC/3in1      Prior Function Prior Level of Function : Independent/Modified Independent                     Hand Dominance  Extremity/Trunk Assessment   Upper Extremity Assessment Upper Extremity Assessment: Overall WFL for tasks assessed    Lower Extremity Assessment Lower Extremity Assessment: Overall WFL for tasks assessed    Cervical / Trunk Assessment Cervical / Trunk Assessment: Kyphotic  Communication   Communication: No difficulties  Cognition Arousal/Alertness: Awake/alert Behavior During Therapy: WFL for tasks  assessed/performed Overall Cognitive Status: Within Functional Limits for tasks assessed                                          General Comments      Exercises     Assessment/Plan    PT Assessment Patient needs continued PT services  PT Problem List Decreased strength;Decreased mobility       PT Treatment Interventions DME instruction;Gait training;Functional mobility training;Therapeutic exercise;Patient/family education;Therapeutic activities    PT Goals (Current goals can be found in the Care Plan section)  Acute Rehab PT Goals Patient Stated Goal: go home PT Goal Formulation: With patient/family Time For Goal Achievement: 02/07/22 Potential to Achieve Goals: Good    Frequency Min 3X/week     Co-evaluation               AM-PAC PT "6 Clicks" Mobility  Outcome Measure Help needed turning from your back to your side while in a flat bed without using bedrails?: None Help needed moving from lying on your back to sitting on the side of a flat bed without using bedrails?: None Help needed moving to and from a bed to a chair (including a wheelchair)?: A Little Help needed standing up from a chair using your arms (e.g., wheelchair or bedside chair)?: A Little Help needed to walk in hospital room?: A Little Help needed climbing 3-5 steps with a railing? : A Little 6 Click Score: 20    End of Session Equipment Utilized During Treatment: Gait belt Activity Tolerance: Patient tolerated treatment well Patient left: in chair;with chair alarm set;with family/visitor present Nurse Communication: Mobility status PT Visit Diagnosis: Difficulty in walking, not elsewhere classified (R26.2)    Time: 1320-1350 PT Time Calculation (min) (ACUTE ONLY): 30 min   Charges:   PT Evaluation $PT Eval Low Complexity: 1 Low PT Treatments $Gait Training: 8-22 mins        [place] Office [phone] Weekend  pager-[phone]    Claretha Cooper 01/24/2022, 2:55 PM

## 2022-01-24 NOTE — Progress Notes (Signed)
PROGRESS NOTE  Mike Donaldson.  DOB: 1950/08/20  PCP: [place] NID:[id number]  DOA: 01/22/2022  LOS: 2 days  Hospital Day: 3  Brief narrative: Mike Fouche. is a 71 y.o. male with PMH significant for HTN, cholangiocarcinoma, chronic pain. Patient presented to the ED on 7/13 for evaluation of confusion and mention of pneumonia noted in outpatient imaging.  For cholangiocarcinoma, patient follows up with oncologist at [facility].  Diagnosed in November 2022 and has been on chemotherapy since then.  Completed 8 cycles of chemotherapy in June. For the last 1 to 2 weeks, patient has poor oral intake, progressively worsening weakness, constipation and he also developed confusion and altered mentation. He followed up with his oncologist.  CT scan was performed on 7/12 to look for progression of disease.  One of the findings reading was pneumonia.  Patient endorsed symptoms of exertional dyspnea, generalized weakness, somnolence and confusion and hence he was directed to the ED.  In the ED, patient initially had a low temperature of 99.2, heart rate 68, blood pressure 124/72, breathing on room air Labs showed WC count elevated to 11.8, hemoglobin low at 9.4, sodium level low at 130, glucose elevated 187, BUN/creatinine elevated to 46/1.72, initial lactic acid was low at 1.9 After several hours in the ED, patient spiked a fever of 101 and lactic acid trended up to 2.7, procalcitonin at 3.97 Chest x-ray showed a new linear patchy infiltrate in the right lower lung suggestive of pneumonia CT head negative for acute intracranial abnormality Patient was started on IV antibiotics, IV fluid and admitted to hospitalist service  Subjective: Patient was seen and examined this morning.   Pleasant elderly Caucasian male.  Lying on bed.  Not in distress.  More awake than yesterday.  Oriented x3 today.  Family not at bedside.  On low-flow oxygen.   Tmax one 1.8 last night. Blood culture sent on  admission is growing Klebsiella pneumoniae  Assessment and plan: Right lower lobe pneumonia pneumonia  Sepsis secondary to pneumonia - POA Klebsiella pneumonia bacteremia -Chemotherapy patient presented with shortness of breath, lethargy, developed fever of 101 in the ED with WBC count and lactic acid elevated  -Chest x-ray with right lower lobe infiltrate  -Blood culture sent on admission is growing Klebsiella pneumoniae -Currently on IV Rocephin, azithromycin.  Patient had fever of one 1.8 last night, WBC count and procalcitonin level are uptrending but patient patient does not look septic and looks clinically improving.  I would continue the same antibiotics and monitor labs. -Check strep pneumo and legionella antigens, trend procalcitonin, continue current antibiotics  Recent Labs  Lab 01/22/22 1743 01/22/22 1746 01/22/22 1954 01/22/22 2316 01/23/22 0437 01/24/22 0348  WBC 11.8*  --   --   --  9.7 12.5*  LATICACIDVEN  --  1.9 2.7* 1.4  --   --   PROCALCITON  --   --  3.97  --  5.25 10.79   A-fib with RVR -New onset A-fib on admission in the setting of sepsis, severe hypokalemia.  Heart rate as high as 180s.  EKG reviewed.   -Patient was started on Cardizem drip.  I stopped Cardizem drip yesterday afternoon but he started to be in RVR again and hence Cardizem was resumed.  It seems last night around 9 PM, his heart rate dipped down to 40s and hence Cardizem drip was stopped.  Heart rate in 60s this morning. -Currently not on any scheduled AV nodal blocking agent.  Obtain  echocardiogram.  AKI  -Baseline creatinine 1.2 from June 2023.  Patient presented with creatinine elevated 1.72.  Likely prerenal related to sepsis.   -Improved with IV fluid.  Encourage oral hydration. Recent Labs    01/22/22 1743 01/23/22 0437 01/24/22 0348  BUN 46* 43* 40*  CREATININE 1.72* 1.32* 1.21   Acute encephalopathy  -Confusion likely related to sepsis, AKI in the setting of physical and mental  decline related to cancer and chemotherapy.   -Minimize use of mood altering medications. -Mental status gradually improving.  Able to follow commands, oriented to place and person, not to time.  Essential hypertension -PTA on amlodipine 10 mg daily, HCTZ 12.5 mg daily.  -Continue amlodipine.  Continue to hold HCTZ.  Continue to monitor blood pressure.   Hyperlipidemia -Continue Lipitor   Hx of PE  -Continue Eliquis    Chronic pain  -PTA on morphine 15 mg twice daily, Robaxin, Cymbalta, oxycodone PRN, meloxicam -Avoid meloxicam because of AKI.  Continue other meds.  Monitor mental status   Cholangiocarcinoma  -patient follows up with oncologist at [facility].  Diagnosed in November 2022 and has been on chemotherapy since then.  Completed 8 cycles of chemotherapy in June. -Patient's wife reports CT from 01/21/22 unfortunately demonstrates progression despite chemotherapy.  To follow-up with oncology at [facility] outpatient  Constipation -No BM in several days.  Started on Senokot scheduled, MiraLAX as needed.  Patient refused Dulcolax yesterday.  Can be given today.   Goals of care   Code Status: Full Code    Mobility: Was functional 2 weeks ago.  Obtain PT eval  Skin assessment:     Nutritional status:  There is no height or weight on file to calculate BMI.          Diet:  Diet Order             Diet regular Room service appropriate? Yes; Fluid consistency: Thin  Diet effective now                   DVT prophylaxis:   apixaban (ELIQUIS) tablet 5 mg   Antimicrobials: IV Rocephin, azithromycin Fluid: None currently Consultants: None Family Communication: Wife not at bedside  Status is: Inpatient  Continue in-hospital care because: IV antibiotics, IV fluid, PT eval pending Level of care: Progressive   Dispo: The patient is from: Home              Anticipated d/c is to: Pending clinical course              Patient currently is not medically stable to d/c.    Difficult to place patient No     Infusions:   cefTRIAXone (ROCEPHIN)  IV Stopped (01/23/22 2108)   diltiazem (CARDIZEM) infusion Stopped (01/23/22 2114)    Scheduled Meds:  apixaban  5 mg Oral BID   atorvastatin  20 mg Oral QPM   azithromycin  500 mg Oral Daily   brinzolamide  1 drop Both Eyes TID   Chlorhexidine Gluconate Cloth  6 each Topical Daily   DULoxetine  20 mg Oral Daily   morphine  15 mg Oral Q12H   mouth rinse  15 mL Mouth Rinse 4 times per day   pantoprazole  40 mg Oral Daily   sodium chloride flush  3 mL Intravenous Q12H    PRN meds: acetaminophen **OR** acetaminophen, bisacodyl, methocarbamol, ondansetron **OR** ondansetron (ZOFRAN) IV, mouth rinse, oxyCODONE, polyethylene glycol, senna-docusate   Antimicrobials: Anti-infectives (From admission, onward)  Start     Dose/Rate Route Frequency Ordered Stop   01/23/22 1730  cefTRIAXone (ROCEPHIN) 2 g in sodium chloride 0.9 % 100 mL IVPB        2 g 200 mL/hr over 30 Minutes Intravenous Every 24 hours 01/22/22 2057 01/27/22 1729   01/23/22 1730  azithromycin (ZITHROMAX) tablet 500 mg        500 mg Oral Daily 01/22/22 2057 01/27/22 0959   01/22/22 1730  cefTRIAXone (ROCEPHIN) 1 g in sodium chloride 0.9 % 100 mL IVPB        1 g 200 mL/hr over 30 Minutes Intravenous  Once 01/22/22 1721 01/22/22 1843   01/22/22 1730  azithromycin (ZITHROMAX) 500 mg in sodium chloride 0.9 % 250 mL IVPB  Status:  Discontinued        500 mg 250 mL/hr over 60 Minutes Intravenous Every 24 hours 01/22/22 1721 01/22/22 2059       Objective: Vitals:   01/24/22 0425 01/24/22 0800  BP:  117/70  Pulse:  62  Resp:  17  Temp: (!) 97.5 F (36.4 C)   SpO2:  96%    Intake/Output Summary (Last 24 hours) at 01/24/2022 1002 Last data filed at 01/24/2022 0800 Gross per 24 hour  Intake 1375.99 ml  Output 1345 ml  Net 30.99 ml   There were no vitals filed for this visit. Weight change:  There is no height or weight on file to calculate  BMI.   Physical Exam: General exam: Pleasant, elderly Caucasian male.  Not in physical distress Skin: No rashes, lesions or ulcers. HEENT: Atraumatic, normocephalic, no obvious bleeding Lungs: Diminished air entry in both bases, otherwise clear to auscultation bilaterally CVS: Regular rate and rhythm, no murmur GI/Abd soft, nontender, nondistended, bowel sound present CNS: Alert, awake, oriented x3 Psychiatry: Mood appropriate Extremities: No pedal edema, no calf tenderness  Data Review: I have personally reviewed the laboratory data and studies available.  F/u labs ordered Unresulted Labs (From admission, onward)     Start     Ordered   01/23/22 8182  Basic metabolic panel  Daily at 5am,   R      01/22/22 2057   01/23/22 0500  CBC  Daily at 5am,   R      01/22/22 2057   01/22/22 2056  Legionella Pneumophila Serogp 1 Ur Ag  (COPD / Pneumonia / Cellulitis / Lower Extremity Wound)  Once,   R        01/22/22 2057   01/22/22 2056  Expectorated Sputum Assessment w Gram Stain, Rflx to Resp Cult  (COPD / Pneumonia / Cellulitis / Lower Extremity Wound)  Once,   R        01/22/22 2057   01/22/22 1711  Culture, blood (routine x 2)  BLOOD CULTURE X 2,   R      01/22/22 1710            Signed, Terrilee Croak, MD [facility] 01/24/2022

## 2022-01-24 NOTE — Progress Notes (Signed)
Pt refusing CPAP QHS at this time.  

## 2022-01-24 NOTE — Progress Notes (Signed)
PHARMACY - PHYSICIAN COMMUNICATION CRITICAL VALUE ALERT - BLOOD CULTURE IDENTIFICATION (BCID)  Mike Yanko. is an 71 y.o. male who presented to [facility] on 01/22/2022 with a chief complaint of confusion  Assessment: 1/2 aerobic, kleb pna, no Resistance  Name of physician (or Provider) Contacted: B Kyere  Current antibiotics: CTX 2gm IV q24h  Changes to prescribed antibiotics recommended:  Patient is on recommended antibiotics - No changes needed  Results for orders placed or performed during the hospital encounter of 01/22/22  Blood Culture ID Panel (Reflexed) (Collected: 01/22/2022  5:43 PM)  Result Value Ref Range   Enterococcus faecalis NOT DETECTED NOT DETECTED   Enterococcus Faecium NOT DETECTED NOT DETECTED   Listeria monocytogenes NOT DETECTED NOT DETECTED   Staphylococcus species NOT DETECTED NOT DETECTED   Staphylococcus aureus (BCID) NOT DETECTED NOT DETECTED   Staphylococcus epidermidis NOT DETECTED NOT DETECTED   Staphylococcus lugdunensis NOT DETECTED NOT DETECTED   Streptococcus species NOT DETECTED NOT DETECTED   Streptococcus agalactiae NOT DETECTED NOT DETECTED   Streptococcus pneumoniae NOT DETECTED NOT DETECTED   Streptococcus pyogenes NOT DETECTED NOT DETECTED   A.calcoaceticus-baumannii NOT DETECTED NOT DETECTED   Bacteroides fragilis NOT DETECTED NOT DETECTED   Enterobacterales DETECTED (A) NOT DETECTED   Enterobacter cloacae complex NOT DETECTED NOT DETECTED   Escherichia coli NOT DETECTED NOT DETECTED   Klebsiella aerogenes NOT DETECTED NOT DETECTED   Klebsiella oxytoca NOT DETECTED NOT DETECTED   Klebsiella pneumoniae DETECTED (A) NOT DETECTED   Proteus species NOT DETECTED NOT DETECTED   Salmonella species NOT DETECTED NOT DETECTED   Serratia marcescens NOT DETECTED NOT DETECTED   Haemophilus influenzae NOT DETECTED NOT DETECTED   Neisseria meningitidis NOT DETECTED NOT DETECTED   Pseudomonas aeruginosa NOT DETECTED NOT DETECTED    Stenotrophomonas maltophilia NOT DETECTED NOT DETECTED   Candida albicans NOT DETECTED NOT DETECTED   Candida auris NOT DETECTED NOT DETECTED   Candida glabrata NOT DETECTED NOT DETECTED   Candida krusei NOT DETECTED NOT DETECTED   Candida parapsilosis NOT DETECTED NOT DETECTED   Candida tropicalis NOT DETECTED NOT DETECTED   Cryptococcus neoformans/gattii NOT DETECTED NOT DETECTED   CTX-M ESBL NOT DETECTED NOT DETECTED   Carbapenem resistance IMP NOT DETECTED NOT DETECTED   Carbapenem resistance KPC NOT DETECTED NOT DETECTED   Carbapenem resistance NDM NOT DETECTED NOT DETECTED   Carbapenem resist OXA 48 LIKE NOT DETECTED NOT DETECTED   Carbapenem resistance VIM NOT DETECTED NOT DETECTED    Dolly Rias RPh 01/24/2022, 3:44 AM

## 2022-01-25 ENCOUNTER — Inpatient Hospital Stay (HOSPITAL_COMMUNITY): Payer: Medicare Other

## 2022-01-25 DIAGNOSIS — R9431 Abnormal electrocardiogram [ECG] [EKG]: Secondary | ICD-10-CM | POA: Diagnosis not present

## 2022-01-25 DIAGNOSIS — J189 Pneumonia, unspecified organism: Secondary | ICD-10-CM | POA: Diagnosis not present

## 2022-01-25 LAB — BASIC METABOLIC PANEL
Anion gap: 9 (ref 5–15)
BUN: 35 mg/dL — ABNORMAL HIGH (ref 8–23)
CO2: 21 mmol/L — ABNORMAL LOW (ref 22–32)
Calcium: 8.1 mg/dL — ABNORMAL LOW (ref 8.9–10.3)
Chloride: 102 mmol/L (ref 98–111)
Creatinine, Ser: 0.96 mg/dL (ref 0.61–1.24)
GFR, Estimated: >60 mL/min (ref >60)
Glucose, Bld: 100 mg/dL — ABNORMAL HIGH (ref 70–99)
Potassium: 3.3 mmol/L — ABNORMAL LOW (ref 3.5–5.1)
Sodium: 132 mmol/L — ABNORMAL LOW (ref 135–145)

## 2022-01-25 LAB — ECHOCARDIOGRAM COMPLETE
AR max vel: 1.88 cm2
AV Area VTI: 1.8 cm2
AV Area mean vel: 1.81 cm2
AV Mean grad: 5.5 mmHg
AV Peak grad: 10.4 mmHg
Ao pk vel: 1.61 m/s
Area-P 1/2: 3.42 cm2
Calc EF: 53.3 %
P 1/2 time: 534 msec
S' Lateral: 2.6 cm
Single Plane A2C EF: 54.1 %
Single Plane A4C EF: 52.3 %
Weight: 2398.6 oz

## 2022-01-25 LAB — CBC
HCT: 25.3 % — ABNORMAL LOW (ref 39.0–52.0)
Hemoglobin: 8.1 g/dL — ABNORMAL LOW (ref 13.0–17.0)
MCH: 29.3 pg (ref 26.0–34.0)
MCHC: 32 g/dL (ref 30.0–36.0)
MCV: 91.7 fL (ref 80.0–100.0)
Platelets: 279 10*3/uL (ref 150–400)
RBC: 2.76 MIL/uL — ABNORMAL LOW (ref 4.22–5.81)
RDW: 15.9 % — ABNORMAL HIGH (ref 11.5–15.5)
WBC: 8.5 10*3/uL (ref 4.0–10.5)
nRBC: 0.6 % — ABNORMAL HIGH (ref 0.0–0.2)

## 2022-01-25 MED ORDER — POTASSIUM CHLORIDE CRYS ER 20 MEQ PO TBCR
40.0000 meq | EXTENDED_RELEASE_TABLET | Freq: Once | ORAL | Status: AC
  Administered 2022-01-25: 40 meq via ORAL
  Filled 2022-01-25: qty 2

## 2022-01-25 MED ORDER — SODIUM CHLORIDE 0.9 % IV SOLN
2.0000 g | INTRAVENOUS | Status: DC
  Administered 2022-01-25: 2 g via INTRAVENOUS
  Filled 2022-01-25: qty 20

## 2022-01-25 NOTE — Progress Notes (Signed)
Pt refuses CPAP QHS. 

## 2022-01-25 NOTE — Progress Notes (Signed)
Initial Nutrition Assessment  DOCUMENTATION CODES:   Not applicable  INTERVENTION:   Ensure Enlive po TID, each supplement provides 350 kcal and 20 grams of protein.  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  RD working remotely.  Spoke with pt's wife via phone. Wife reports pt with poor appetite and oral intake for 1-2 weeks pta. Wife reports pt eating only sips/bites in hospital. Pt has been drinking chocolate Boost at home along with some milkshakes. Per chart, pt is down 15lbs(9%) since November. RD discussed with pt's wife the importance of adequate nutrition needed to preserve lean muscle. RD will add supplements and MVI to help pt meet his estimated needs. Pt is at high refeed risk. RD will obtain nutrition related history and exam at follow up. Pt at high risk for malnutrition.   Medications reviewed and include: azithromycin, protonix, ceftriaxone  Labs reviewed: Na 132(L), K 3.3(L), Mg 1.7 wnl Hgb 8.1(L), Hct 25.3(L) Cbgs- 164, 197 x 24 hrs  NUTRITION - FOCUSED PHYSICAL EXAM: Unable to perform at this time   Diet Order:   Diet Order             Diet regular Room service appropriate? Yes; Fluid consistency: Thin  Diet effective now                  EDUCATION NEEDS:   Education needs have been addressed  Skin:  Skin Assessment: Reviewed RN Assessment  Last BM:  7/15- TYPE  5  Height:   Ht Readings from Last 1 Encounters:  01/25/22 '5\' 9"'$  (1.753 m)    Weight:   Wt Readings from Last 1 Encounters:  01/25/22 68 kg    Ideal Body Weight:  72.7 kg  BMI:  Body mass index is 22.14 kg/m.  Estimated Nutritional Needs:   Kcal:  1900-2200kcal/day  Protein:  95-110g/day  Fluid:  1.8-2.1L/day  Koleen Distance MS, RD, LDN Please refer to [facility] for RD and/or RD on-call/weekend/after hours pager

## 2022-01-25 NOTE — Progress Notes (Signed)
PROGRESS NOTE  Mike Donaldson.  DOB: [birth year]  PCP: [place] LPF:[id number]  DOA: 01/22/2022  LOS: 3 days  Hospital Day: 4  Brief narrative: Mike Donaldson. is a 71 y.o. male with PMH significant for HTN, cholangiocarcinoma, chronic pain. Patient presented to the ED on 7/13 for evaluation of confusion and mention of pneumonia noted in outpatient imaging.  For cholangiocarcinoma, patient follows up with oncologist at [facility].  Diagnosed in November 2022 and has been on chemotherapy since then.  Completed 8 cycles of chemotherapy in June. For the last 1 to 2 weeks, patient has poor oral intake, progressively worsening weakness, constipation and he also developed confusion and altered mentation. He followed up with his oncologist.  CT scan was performed on 7/12 to look for progression of disease.  One of the findings reading was pneumonia.  Patient endorsed symptoms of exertional dyspnea, generalized weakness, somnolence and confusion and hence he was directed to the ED.  In the ED, patient initially had a low temperature of 99.2, heart rate 68, blood pressure 124/72, breathing on room air Labs showed WC count elevated to 11.8, hemoglobin low at 9.4, sodium level low at 130, glucose elevated 187, BUN/creatinine elevated to 46/1.72, initial lactic acid was low at 1.9 After several hours in the ED, patient spiked a fever of 101 and lactic acid trended up to 2.7, procalcitonin at 3.97 Chest x-ray showed a new linear patchy infiltrate in the right lower lung suggestive of pneumonia CT head negative for acute intracranial abnormality Patient was started on IV antibiotics, IV fluid and admitted to hospitalist service  Subjective: Patient was seen and examined this morning.   Lying on bed.  Not in distress.  No new symptoms. Feels better.  Mentating better.  Able to walk on the hallway yesterday with PT and without supplemental oxygen.. Wife at bedside this morning. Blood culture  sensitivity report pending.  Assessment and plan: Right lower lobe pneumonia pneumonia  Sepsis secondary to pneumonia - POA -Chemotherapy patient presented with shortness of breath, lethargy, developed fever of 101 in the ED with WBC count and lactic acid elevated  -Chest x-ray with right lower lobe infiltrate  -Blood culture sent on admission is growing Klebsiella pneumoniae.  Pending sensitivity report. -In last 24 hours, no fever, WBC count remains normal.  Continue same antibiotics Recent Labs  Lab 01/22/22 1743 01/22/22 1746 01/22/22 1954 01/22/22 2316 01/23/22 0437 01/24/22 0348 01/25/22 0516  WBC 11.8*  --   --   --  9.7 12.5* 8.5  LATICACIDVEN  --  1.9 2.7* 1.4  --   --   --   PROCALCITON  --   --  3.97  --  5.25 10.79  --     A-fib with RVR -New onset A-fib on admission in the setting of sepsis, severe hypokalemia.  Heart rate as high as 180s.  He was started on Cardizem drip and gradually weaned off.  Not on any AV nodal blocking agent currently.  Heart rate stable in 60s and 70s for last 24 hours. -echocardiogram report pending  AKI  -Baseline creatinine 1.2 from June 2023.  Patient presented with creatinine elevated 1.72.  Likely prerenal related to sepsis.   -Improved with IV fluid.  Encourage oral hydration. Recent Labs    01/22/22 1743 01/23/22 0437 01/24/22 0348 01/25/22 0516  BUN 46* 43* 40* 35*  CREATININE 1.72* 1.32* 1.21 0.96    Acute encephalopathy  -Confusion likely related to sepsis, AKI in  the setting of physical and mental decline related to cancer and chemotherapy.   -Minimize use of mood altering medications. -Mental status gradually improving.  Able to follow commands, oriented x3 this morning.  Essential hypertension -PTA on amlodipine 10 mg daily, HCTZ 12.5 mg daily.  -Continue amlodipine.  Continue to hold HCTZ.  Continue to monitor blood pressure.   Hyperlipidemia -Continue Lipitor   Hx of PE  -Continue Eliquis    Chronic pain   -PTA on morphine 15 mg twice daily, Robaxin, Cymbalta, oxycodone PRN, meloxicam -Avoid meloxicam because of AKI.  Continue other meds.  Monitor mental status   Cholangiocarcinoma  -patient follows up with oncologist at [facility].  Diagnosed in November 2022 and has been on chemotherapy since then.  Completed 8 cycles of chemotherapy in June. -Patient's wife reports CT from 01/21/22 unfortunately demonstrates progression despite chemotherapy.  To follow-up with oncology at [facility] outpatient  Constipation -Continue Senokot scheduled, MiraLAX as needed.     Goals of care   Code Status: Full Code    Mobility: Was functional 2 weeks ago.  No follow-up required per PT  Skin assessment:     Nutritional status:  Body mass index is 22.14 kg/m.          Diet:  Diet Order             Diet regular Room service appropriate? Yes; Fluid consistency: Thin  Diet effective now                   DVT prophylaxis:   apixaban (ELIQUIS) tablet 5 mg   Antimicrobials: IV Rocephin, azithromycin Fluid: None currently Consultants: None Family Communication: Wife not at bedside  Status is: Inpatient  Continue in-hospital care because: IV antibiotics, pending culture report Level of care: Progressive   Dispo: The patient is from: Home              Anticipated d/c is to: Hopefully home tomorrow if clinically stable and if blood culture report finalizes.              Patient currently is not medically stable to d/c.   Difficult to place patient No     Infusions:   cefTRIAXone (ROCEPHIN)  IV Stopped (01/24/22 1745)   diltiazem (CARDIZEM) infusion Stopped (01/23/22 2114)    Scheduled Meds:  apixaban  5 mg Oral BID   atorvastatin  20 mg Oral QPM   azithromycin  500 mg Oral Daily   brinzolamide  1 drop Both Eyes TID   Chlorhexidine Gluconate Cloth  6 each Topical Daily   DULoxetine  20 mg Oral Daily   morphine  15 mg Oral Q12H   mouth rinse  15 mL Mouth Rinse 4 times per day    pantoprazole  40 mg Oral Daily   sodium chloride flush  3 mL Intravenous Q12H    PRN meds: acetaminophen **OR** acetaminophen, bisacodyl, methocarbamol, ondansetron **OR** ondansetron (ZOFRAN) IV, mouth rinse, oxyCODONE, polyethylene glycol, senna-docusate   Antimicrobials: Anti-infectives (From admission, onward)    Start     Dose/Rate Route Frequency Ordered Stop   01/23/22 1730  cefTRIAXone (ROCEPHIN) 2 g in sodium chloride 0.9 % 100 mL IVPB        2 g 200 mL/hr over 30 Minutes Intravenous Every 24 hours 01/22/22 2057 01/27/22 1729   01/23/22 1730  azithromycin (ZITHROMAX) tablet 500 mg        500 mg Oral Daily 01/22/22 2057 01/27/22 0959   01/22/22 1730  cefTRIAXone (  ROCEPHIN) 1 g in sodium chloride 0.9 % 100 mL IVPB        1 g 200 mL/hr over 30 Minutes Intravenous  Once 01/22/22 1721 01/22/22 1843   01/22/22 1730  azithromycin (ZITHROMAX) 500 mg in sodium chloride 0.9 % 250 mL IVPB  Status:  Discontinued        500 mg 250 mL/hr over 60 Minutes Intravenous Every 24 hours 01/22/22 1721 01/22/22 2059       Objective: Vitals:   01/25/22 0900 01/25/22 1000  BP: (!) 122/56 122/63  Pulse: 70 73  Resp: 16 18  Temp:    SpO2: 97% 98%    Intake/Output Summary (Last 24 hours) at 01/25/2022 1014 Last data filed at 01/25/2022 0800 Gross per 24 hour  Intake 220.06 ml  Output 540 ml  Net -319.94 ml    Filed Weights   01/25/22 0500  Weight: 68 kg   Weight change:  Body mass index is 22.14 kg/m.   Physical Exam: General exam: Pleasant, elderly Caucasian male.  Not in physical distress.  Feels much better Skin: No rashes, lesions or ulcers. HEENT: Atraumatic, normocephalic, no obvious bleeding Lungs: Diminished air entry in both bases, otherwise clear to auscultation bilaterally CVS: Regular rate and rhythm, no murmur GI/Abd soft, nontender, nondistended, bowel sound present CNS: Alert, awake, oriented x3 Psychiatry: Mood appropriate Extremities: No pedal edema, no calf  tenderness  Data Review: I have personally reviewed the laboratory data and studies available.  F/u labs ordered Unresulted Labs (From admission, onward)     Start     Ordered   01/22/22 2056  Legionella Pneumophila Serogp 1 Ur Ag  (COPD / Pneumonia / Cellulitis / Lower Extremity Wound)  Once,   R        01/22/22 2057   01/22/22 2056  Expectorated Sputum Assessment w Gram Stain, Rflx to Resp Cult  (COPD / Pneumonia / Cellulitis / Lower Extremity Wound)  Once,   R        01/22/22 2057            Signed, Terrilee Croak, MD [facility] 01/25/2022

## 2022-01-26 DIAGNOSIS — J189 Pneumonia, unspecified organism: Secondary | ICD-10-CM | POA: Diagnosis not present

## 2022-01-26 LAB — CULTURE, BLOOD (ROUTINE X 2)

## 2022-01-26 MED ORDER — MIRTAZAPINE 15 MG PO TABS: 7.5000 mg | ORAL_TABLET | Freq: Every day | ORAL | Status: DC

## 2022-01-26 MED ORDER — CEFDINIR 300 MG PO CAPS: 300.0000 mg | ORAL_CAPSULE | Freq: Two times a day (BID) | ORAL | 0 refills | Status: AC

## 2022-01-26 MED ORDER — CEFDINIR 300 MG PO CAPS
300.0000 mg | ORAL_CAPSULE | Freq: Two times a day (BID) | ORAL | Status: DC
Start: 2022-01-26 — End: 2022-01-26
  Administered 2022-01-26: 300 mg via ORAL
  Filled 2022-01-26 (×2): qty 1

## 2022-01-26 MED ORDER — SACCHAROMYCES BOULARDII 250 MG PO CAPS: 250.0000 mg | ORAL_CAPSULE | Freq: Two times a day (BID) | ORAL | 0 refills | Status: DC

## 2022-01-26 MED ORDER — SENNOSIDES-DOCUSATE SODIUM 8.6-50 MG PO TABS: 1.0000 | ORAL_TABLET | Freq: Every day | ORAL | 0 refills | Status: AC

## 2022-01-26 MED ORDER — CEFDINIR 300 MG PO CAPS: 300.0000 mg | ORAL_CAPSULE | Freq: Two times a day (BID) | ORAL | 0 refills | Status: DC

## 2022-01-26 MED ORDER — MIRTAZAPINE 7.5 MG PO TABS
7.5000 mg | ORAL_TABLET | Freq: Every day | ORAL | 0 refills | Status: DC
Start: 2022-01-26 — End: 2022-01-26

## 2022-01-26 MED ORDER — HEPARIN SOD (PORK) LOCK FLUSH 100 UNIT/ML IV SOLN
500.0000 [IU] | INTRAVENOUS | Status: AC | PRN
  Administered 2022-01-26: 500 [IU]
  Filled 2022-01-26: qty 5

## 2022-01-26 MED ORDER — SACCHAROMYCES BOULARDII 250 MG PO CAPS: 250.0000 mg | ORAL_CAPSULE | Freq: Two times a day (BID) | ORAL | 0 refills | Status: AC

## 2022-01-26 MED ORDER — MIRTAZAPINE 7.5 MG PO TABS: 7.5000 mg | ORAL_TABLET | Freq: Every day | ORAL | 0 refills | Status: AC

## 2022-01-26 NOTE — Discharge Summary (Addendum)
Physician Discharge Summary  [name]. WSF:[id number] DOB: 02-May-1951 DOA: 01/22/2022  PCP: [facility]  Admit date: 01/22/2022 Discharge date: 01/26/2022  Admitted From: Home Discharge disposition: Home  Recommendations at discharge:  Omnicef with probiotics for next 5 days Stop HCTZ to prevent further dehydration Remeron nightly to boost appetite  Brief narrative: Mike Donaldson. is a 71 y.o. male with PMH significant for HTN, cholangiocarcinoma, chronic pain. Patient presented to the ED on 7/13 for evaluation of confusion and mention of pneumonia noted in outpatient imaging.  For cholangiocarcinoma, patient follows up with oncologist at [facility]  Diagnosed in November 2022 and has been on chemotherapy since then.  Completed 8 cycles of chemotherapy in June. For the last 1 to 2 weeks, patient has poor oral intake, progressively worsening weakness, constipation and he also developed confusion and altered mentation. He followed up with his oncologist.  CT scan was performed on 7/12 to look for progression of disease.  One of the findings reading was pneumonia.  Patient endorsed symptoms of exertional dyspnea, generalized weakness, somnolence and confusion and hence he was directed to the ED.  In the ED, patient initially had a low temperature of 99.2, heart rate 68, blood pressure 124/72, breathing on room air Labs showed WC count elevated to 11.8, hemoglobin low at 9.4, sodium level low at 130, glucose elevated 187, BUN/creatinine elevated to 46/1.72, initial lactic acid was low at 1.9 After several hours in the ED, patient spiked a fever of 101 and lactic acid trended up to 2.7, procalcitonin at 3.97 Chest x-ray showed a new linear patchy infiltrate in the right lower lung suggestive of pneumonia CT head negative for acute intracranial abnormality Patient was started on IV antibiotics, IV fluid and admitted to hospitalist service  Subjective: Patient was seen and  examined this morning.   Lying on bed.  Not in distress.  Not on oxygen.  Able to walk.  Had a good bowel movement yesterday.  Daughter at bedside.  Hospital course: Right lower lobe pneumonia pneumonia  Sepsis secondary to pneumonia - POA -Chemotherapy patient presented with shortness of breath, lethargy, developed fever of 101 in the ED with WBC count and lactic acid elevated  -Chest x-ray with right lower lobe infiltrate  -Blood culture sent on admission is growing Klebsiella pneumoniae.  Pending sensitivity report.  But clinically he is significantly improving. -In last 48 hours, no fever, WBC count remains normal.  Currently improving on IV Rocephin and azithromycin.  I will switch him to oral Omnicef with a plan to continue it for next 5 days at discharge with probiotics. Recent Labs  Lab 01/22/22 1743 01/22/22 1746 01/22/22 1954 01/22/22 2316 01/23/22 0437 01/24/22 0348 01/25/22 0516  WBC 11.8*  --   --   --  9.7 12.5* 8.5  LATICACIDVEN  --  1.9 2.7* 1.4  --   --   --   PROCALCITON  --   --  3.97  --  5.25 10.79  --    A-fib with RVR -New onset A-fib on admission in the setting of sepsis, severe hypokalemia.  Heart rate as high as 180s.  He was started on Cardizem drip and gradually weaned off.  Not on any AV nodal blocking agent currently.  Heart rate remains stable. -He was started on anticoagulation on admission.  I discussed with his family and made a decision to stop Eliquis based on his short-lived nature of A-fib, low CHA2DS2-VASc score, poor oral intake, risk  of dehydration, AKI, falls with worsening malignancy. -echocardiogram showed normal EF at 60 to 65%.  AKI  -Baseline creatinine 1.2 from June 2023.  Patient presented with creatinine elevated 1.72.  Likely prerenal related to sepsis.   -Improved with IV fluid.  Encourage oral hydration. Recent Labs    01/22/22 1743 01/23/22 0437 01/24/22 0348 01/25/22 0516  BUN 46* 43* 40* 35*  CREATININE 1.72* 1.32* 1.21  0.96   Acute encephalopathy  -Confusion likely related to sepsis, AKI in the setting of physical and mental decline related to cancer and chemotherapy.   -Minimize use of mood altering medications. -Mental status gradually improved.  Back to baseline alert, awake, oriented x3.  Acute urinary retention -In the ED, he was noted to have urine retention and required Foley catheter.  Foley catheter removed today.  But he was not able to void despite retaining 394 mL urine.  Foley catheter was reinserted.  Discharge with Foley catheter to follow-up with urology as an outpatient.  Essential hypertension -PTA on amlodipine 10 mg daily, HCTZ 12.5 mg daily.  -Currently blood pressure is controlled with amlodipine.  HCTZ was held.  I would not resume it at discharge because of the associated risk of dehydration from already compromised oral intake.  Hyperlipidemia -Continue Lipitor   Hx of PE  -Continue Eliquis    Chronic pain  -PTA on morphine 15 mg twice daily, Robaxin, Cymbalta, oxycodone PRN, meloxicam -Avoid meloxicam because of AKI.  Continue other meds.     Cholangiocarcinoma  -patient follows up with oncologist at [facility].  Diagnosed in November 2022 and has been on chemotherapy since then.  Completed 8 cycles of chemotherapy in June. -Patient's wife reports CT from 01/21/22 unfortunately demonstrates progression despite chemotherapy.  To follow-up with oncology at [facility] outpatient -Remeron 7.5 mg nightly to boost appetite.  Constipation -Continue Senokot scheduled, MiraLAX as needed.    Wounds:  -    Discharge Exam:   Vitals:   01/26/22 0500 01/26/22 0600 01/26/22 0700 01/26/22 0800  BP: (!) 109/48 114/61 (!) 125/58 126/61  Pulse: 74 78 73 70  Resp: '13 16 14 '$ (!) 22  Temp:    98.3 F (36.8 C)  TempSrc:    Oral  SpO2: 93% 93% 94% 96%  Weight: 68 kg     Height:        Body mass index is 22.14 kg/m.   General exam: Pleasant, elderly Caucasian male.  Not in physical distress.   Feels much better Skin: No rashes, lesions or ulcers. HEENT: Atraumatic, normocephalic, no obvious bleeding Lungs: Diminished air entry in both bases, otherwise clear to auscultation bilaterally CVS: Regular rate and rhythm, no murmur GI/Abd soft, nontender, nondistended, bowel sound present CNS: Alert, awake, oriented x3 Psychiatry: Mood appropriate Extremities: No pedal edema, no calf tenderness  Follow ups:    [facility]   Specialty: General Practice Contact information: [place] [id number] [phone]         [name], MD .   Specialty: Cardiology Contact information: [address] [place] [address] [phone]                 Discharge Instructions:   Discharge Instructions     Call MD for:  difficulty breathing, headache or visual disturbances   Complete by: As directed    Call MD for:  extreme fatigue   Complete by: As directed    Call MD  for:  hives   Complete by: As directed    Call MD for:  persistant dizziness or light-headedness   Complete by: As directed    Call MD for:  persistant nausea and vomiting   Complete by: As directed    Call MD for:  severe uncontrolled pain   Complete by: As directed    Call MD for:  temperature >100.4   Complete by: As directed    Diet general   Complete by: As directed    Discharge instructions   Complete by: As directed    Recommendations at discharge:   Omnicef with probiotics for next 5 days  Stop HCTZ to prevent further dehydration  Remeron nightly to boost appetite  General discharge instructions: Follow with Primary MD [place] in 7 days  Please request your PCP  to go over your hospital tests, procedures, radiology results at the follow up. Please get your medicines reviewed and adjusted.  Your PCP may decide to repeat certain labs or tests as needed. Do not drive, operate heavy machinery,  perform activities at heights, swimming or participation in water activities or provide baby sitting services if your were admitted for syncope or siezures until you have seen by Primary MD or a Neurologist and advised to do so again. [place] Controlled Substance Reporting System database was reviewed. Do not drive, operate heavy machinery, perform activities at heights, swim, participate in water activities or provide baby-sitting services while on medications for pain, sleep and mood until your outpatient physician has reevaluated you and advised to do so again.  You are strongly recommended to comply with the dose, frequency and duration of prescribed medications. Activity: As tolerated with Full fall precautions use walker/cane & assistance as needed Avoid using any recreational substances like cigarette, tobacco, alcohol, or non-prescribed drug. If you experience worsening of your admission symptoms, develop shortness of breath, life threatening emergency, suicidal or homicidal thoughts you must seek medical attention immediately by calling 911 or calling your MD immediately  if symptoms less severe. You must read complete instructions/literature along with all the possible adverse reactions/side effects for all the medicines you take and that have been prescribed to you. Take any new medicine only after you have completely understood and accepted all the possible adverse reactions/side effects.  Wear Seat belts while driving. You were cared for by a hospitalist during your hospital stay. If you have any questions about your discharge medications or the care you received while you were in the hospital after you are discharged, you can call the unit and ask to speak with the hospitalist or the covering physician. Once you are discharged, your primary care physician will handle any further medical issues. Please note that NO REFILLS for any discharge medications will be authorized once you are  discharged, as it is imperative that you return to your primary care physician (or establish a relationship with a primary care physician if you do not have one).   Increase activity slowly   Complete by: As directed        Discharge Medications:   Allergies as of 01/26/2022       Reactions   Codeine Anaphylaxis   Latanoprost Other (See Comments)   Other reaction(s): Headache Headache   Metoprolol Other (See Comments)   Other reaction(s): Bradycardia bradycardic   Penicillin G    Tamsulosin    Other reaction(s): Blurring of visual image, Dizziness        Medication List  STOP taking these medications    hydrochlorothiazide 25 MG tablet Commonly known as: HYDRODIURIL   meloxicam 7.5 MG tablet Commonly known as: MOBIC   potassium chloride SA 20 MEQ tablet Commonly known as: KLOR-CON M       TAKE these medications    amLODipine 10 MG tablet Commonly known as: NORVASC Take 10 mg by mouth daily.   atorvastatin 20 MG tablet Commonly known as: LIPITOR Take 20 mg by mouth every evening.   Brinzolamide-Brimonidine 1-0.2 % Susp Place 1 drop into both eyes 2 (two) times daily.   cefdinir 300 MG capsule Commonly known as: OMNICEF Take 1 capsule (300 mg total) by mouth every 12 (twelve) hours for 5 days.   DULoxetine 20 MG capsule Commonly known as: CYMBALTA Take 20 mg by mouth daily.   methocarbamol 500 MG tablet Commonly known as: ROBAXIN Take 500 mg by mouth at bedtime as needed for muscle spasms.   mirtazapine 7.5 MG tablet Commonly known as: REMERON Take 1 tablet (7.5 mg total) by mouth at bedtime.   morphine 15 MG tablet Commonly known as: MSIR Take 1 tablet by mouth every 12 (twelve) hours.   omeprazole 20 MG capsule Commonly known as: PRILOSEC Take 20 mg by mouth daily.   ondansetron 4 MG tablet Commonly known as: ZOFRAN Take 4 mg by mouth every 8 (eight) hours as needed for nausea or vomiting.   oxyCODONE 5 MG immediate release  tablet Commonly known as: Oxy IR/ROXICODONE Take 5 mg by mouth every 6 (six) hours as needed for moderate pain.   saccharomyces boulardii 250 MG capsule Commonly known as: FLORASTOR Take 1 capsule (250 mg total) by mouth 2 (two) times daily for 5 days.   senna-docusate 8.6-50 MG tablet Commonly known as: Senokot-S Take 1 tablet by mouth at bedtime.   Vitamin B-12 5000 MCG Tbdp Take by mouth.   vitamin C 500 MG tablet Commonly known as: ASCORBIC ACID Take 500 mg by mouth daily.   Vitamin D3 25 MCG (1000 UT) Caps Take 1 capsule by mouth daily.         The results of significant diagnostics from this hospitalization (including imaging, microbiology, ancillary and laboratory) are listed below for reference.    Procedures and Diagnostic Studies:   CT Head Wo Contrast  Result Date: 01/22/2022 CLINICAL DATA:  Ill status changes EXAM: CT HEAD WITHOUT CONTRAST TECHNIQUE: Contiguous axial images were obtained from the base of the skull through the vertex without intravenous contrast. RADIATION DOSE REDUCTION: This exam was performed according to the departmental dose-optimization program which includes automated exposure control, adjustment of the mA and/or kV according to patient size and/or use of iterative reconstruction technique. COMPARISON:  None Available. FINDINGS: Brain: No acute intracranial abnormality. Specifically, no hemorrhage, hydrocephalus, mass lesion, acute infarction, or significant intracranial injury. Vascular: No hyperdense vessel or unexpected calcification. Skull: No acute calvarial abnormality. Sinuses/Orbits: No acute findings Other: None IMPRESSION: No acute intracranial abnormality. Electronically Signed   By: Rolm Baptise M.D.   On: 01/22/2022 18:26   DG Chest Portable 1 View  Result Date: 01/22/2022 CLINICAL DATA:  Possible pneumonia seen on CT scan EXAM: PORTABLE CHEST 1 VIEW COMPARISON:  Chest radiograph done on 02/10/2018 and CT chest done on 02/12/2018  FINDINGS: Cardiac size is within normal limits. There are no signs of pulmonary edema. Tip of right IJ chest port is seen in superior vena cava. New linear patchy infiltrate is seen in right lower lung field. Costophrenic angles are clear.  There is no pneumothorax. IMPRESSION: New linear patchy infiltrate in right lower lung field may suggest atelectasis/pneumonia. Electronically Signed   By: Elmer Picker M.D.   On: 01/22/2022 18:07     Labs:   Basic Metabolic Panel: Recent Labs  Lab 01/22/22 1743 01/23/22 0437 01/24/22 0348 01/25/22 0516  NA 130* 134* 134* 132*  K 3.5 2.9* 3.4* 3.3*  CL 93* 96* 99 102  CO2 '26 25 24 '$ 21*  GLUCOSE 187* 137* 170* 100*  BUN 46* 43* 40* 35*  CREATININE 1.72* 1.32* 1.21 0.96  CALCIUM 8.6* 8.6* 8.5* 8.1*  MG 1.7  --   --   --    GFR Estimated Creatinine Clearance: 67.9 mL/min (by C-G formula based on SCr of 0.96 mg/dL). Liver Function Tests: Recent Labs  Lab 01/22/22 1743  AST 48*  ALT 30  ALKPHOS 111  BILITOT 0.7  PROT 6.5  ALBUMIN 2.9*   No results for input(s): "LIPASE", "AMYLASE" in the last 168 hours. Recent Labs  Lab 01/22/22 1743 01/23/22 0544  AMMONIA 17 26   Coagulation profile No results for input(s): "INR", "PROTIME" in the last 168 hours.  CBC: Recent Labs  Lab 01/22/22 1743 01/23/22 0437 01/24/22 0348 01/25/22 0516  WBC 11.8* 9.7 12.5* 8.5  NEUTROABS 9.4*  --   --   --   HGB 9.4* 8.9* 8.2* 8.1*  HCT 27.6* 25.9* 24.6* 25.3*  MCV 87.1 86.6 89.1 91.7  PLT 345 319 298 279   Cardiac Enzymes: No results for input(s): "CKTOTAL", "CKMB", "CKMBINDEX", "TROPONINI" in the last 168 hours. BNP: Invalid input(s): "POCBNP" CBG: Recent Labs  Lab 01/22/22 1737 01/23/22 0131  GLUCAP 197* 164*   D-Dimer No results for input(s): "DDIMER" in the last 72 hours. Hgb A1c No results for input(s): "HGBA1C" in the last 72 hours. Lipid Profile No results for input(s): "CHOL", "HDL", "LDLCALC", "TRIG", "CHOLHDL",  "LDLDIRECT" in the last 72 hours. Thyroid function studies No results for input(s): "TSH", "T4TOTAL", "T3FREE", "THYROIDAB" in the last 72 hours.  Invalid input(s): "FREET3" Anemia work up No results for input(s): "VITAMINB12", "FOLATE", "FERRITIN", "TIBC", "IRON", "RETICCTPCT" in the last 72 hours. Microbiology Recent Results (from the past 240 hour(s))  Resp Panel by RT-PCR (Flu A&B, Covid) Anterior Nasal Swab     Status: None   Collection Time: 01/22/22  5:42 PM   Specimen: Anterior Nasal Swab  Result Value Ref Range Status   SARS Coronavirus 2 by RT PCR NEGATIVE NEGATIVE Final    Comment: (NOTE) SARS-CoV-2 target nucleic acids are NOT DETECTED.  The SARS-CoV-2 RNA is generally detectable in upper respiratory specimens during the acute phase of infection. The lowest concentration of SARS-CoV-2 viral copies this assay can detect is 138 copies/mL. A negative result does not preclude SARS-Cov-2 infection and should not be used as the sole basis for treatment or other patient management decisions. A negative result may occur with  improper specimen collection/handling, submission of specimen other than nasopharyngeal swab, presence of viral mutation(s) within the areas targeted by this assay, and inadequate number of viral copies(<138 copies/mL). A negative result must be combined with clinical observations, patient history, and epidemiological information. The expected result is Negative.  Fact Sheet for Patients:  EntrepreneurPulse.com.au  Fact Sheet for Healthcare Providers:  IncredibleEmployment.be  This test is no t yet approved or cleared by the [place] FDA and  has been authorized for detection and/or diagnosis of SARS-CoV-2 by FDA under an Emergency Use Authorization (EUA). This EUA will remain  in  effect (meaning this test can be used) for the duration of the COVID-19 declaration under Section 564(b)(1) of the Act,  21 U.S.C.section 360bbb-3(b)(1), unless the authorization is terminated  or revoked sooner.       Influenza A by PCR NEGATIVE NEGATIVE Final   Influenza B by PCR NEGATIVE NEGATIVE Final    Comment: (NOTE) The Xpert Xpress SARS-CoV-2/FLU/RSV plus assay is intended as an aid in the diagnosis of influenza from Nasopharyngeal swab specimens and should not be used as a sole basis for treatment. Nasal washings and aspirates are unacceptable for Xpert Xpress SARS-CoV-2/FLU/RSV testing.  Fact Sheet for Patients: EntrepreneurPulse.com.au  Fact Sheet for Healthcare Providers: IncredibleEmployment.be  This test is not yet approved or cleared by the [place] FDA and has been authorized for detection and/or diagnosis of SARS-CoV-2 by FDA under an Emergency Use Authorization (EUA). This EUA will remain in effect (meaning this test can be used) for the duration of the COVID-19 declaration under Section 564(b)(1) of the Act, 21 U.S.C. section 360bbb-3(b)(1), unless the authorization is terminated or revoked.  Performed at [facility], [place] [address], [place], [place] [address]   Culture, blood (routine x 2)     Status: Abnormal   Collection Time: 01/22/22  5:43 PM   Specimen: BLOOD  Result Value Ref Range Status   Specimen Description   Final    BLOOD PORTA CATH Performed at [facility] [address], [place], [place] [address]    Special Requests   Final    BOTTLES DRAWN AEROBIC AND ANAEROBIC Blood Culture adequate volume R PORT Performed at [facility] [address] [place], [place] [address]    Culture  Setup Time   Final    GRAM NEGATIVE RODS AEROBIC BOTTLE ONLY CRITICAL RESULT CALLED TO, READ BACK BY AND VERIFIED WITH: E JACKSON,PHARMD'@0339'$  01/24/22 [place] Performed at [facility], [place] [address], [place], [place] [address]    Culture KLEBSIELLA PNEUMONIAE (A)  Final    Report Status 01/26/2022 FINAL  Final   Organism ID, Bacteria KLEBSIELLA PNEUMONIAE  Final      Susceptibility   Klebsiella pneumoniae - MIC*    AMPICILLIN >=32 RESISTANT Resistant     CEFAZOLIN <=4 SENSITIVE Sensitive     CEFEPIME <=0.12 SENSITIVE Sensitive     CEFTAZIDIME <=1 SENSITIVE Sensitive     CEFTRIAXONE <=0.25 SENSITIVE Sensitive     CIPROFLOXACIN 1 RESISTANT Resistant     GENTAMICIN <=1 SENSITIVE Sensitive     IMIPENEM <=0.25 SENSITIVE Sensitive     TRIMETH/SULFA <=20 SENSITIVE Sensitive     AMPICILLIN/SULBACTAM 4 SENSITIVE Sensitive     PIP/TAZO <=4 SENSITIVE Sensitive     * KLEBSIELLA PNEUMONIAE  Blood Culture ID Panel (Reflexed)     Status: Abnormal   Collection Time: 01/22/22  5:43 PM  Result Value Ref Range Status   Enterococcus faecalis NOT DETECTED NOT DETECTED Final   Enterococcus Faecium NOT DETECTED NOT DETECTED Final   Listeria monocytogenes NOT DETECTED NOT DETECTED Final   Staphylococcus species NOT DETECTED NOT DETECTED Final   Staphylococcus aureus (BCID) NOT DETECTED NOT DETECTED Final   Staphylococcus epidermidis NOT DETECTED NOT DETECTED Final   Staphylococcus lugdunensis NOT DETECTED NOT DETECTED Final   Streptococcus species NOT DETECTED NOT DETECTED Final   Streptococcus agalactiae NOT DETECTED NOT DETECTED Final   Streptococcus pneumoniae NOT DETECTED NOT DETECTED Final   Streptococcus pyogenes NOT DETECTED NOT DETECTED Final   A.calcoaceticus-baumannii NOT DETECTED NOT  DETECTED Final   Bacteroides fragilis NOT DETECTED NOT DETECTED Final   Enterobacterales DETECTED (A) NOT DETECTED Final    Comment: Enterobacterales represent a large order of gram negative bacteria, not a single organism. CRITICAL RESULT CALLED TO, READ BACK BY AND VERIFIED WITH: E JACKSON,PHARMD'@0340'$  01/24/22 [place]    Enterobacter cloacae complex NOT DETECTED NOT DETECTED Final   Escherichia coli NOT DETECTED NOT DETECTED Final   Klebsiella aerogenes NOT DETECTED NOT DETECTED  Final   Klebsiella oxytoca NOT DETECTED NOT DETECTED Final   Klebsiella pneumoniae DETECTED (A) NOT DETECTED Final   Proteus species NOT DETECTED NOT DETECTED Final    Comment: CRITICAL RESULT CALLED TO, READ BACK BY AND VERIFIED WITH: E JACKSON,PHARMD'@0340'$  01/24/22 [place]    Salmonella species NOT DETECTED NOT DETECTED Final   Serratia marcescens NOT DETECTED NOT DETECTED Final   Haemophilus influenzae NOT DETECTED NOT DETECTED Final   Neisseria meningitidis NOT DETECTED NOT DETECTED Final   Pseudomonas aeruginosa NOT DETECTED NOT DETECTED Final   Stenotrophomonas maltophilia NOT DETECTED NOT DETECTED Final   Candida albicans NOT DETECTED NOT DETECTED Final   Candida auris NOT DETECTED NOT DETECTED Final   Candida glabrata NOT DETECTED NOT DETECTED Final   Candida krusei NOT DETECTED NOT DETECTED Final   Candida parapsilosis NOT DETECTED NOT DETECTED Final   Candida tropicalis NOT DETECTED NOT DETECTED Final   Cryptococcus neoformans/gattii NOT DETECTED NOT DETECTED Final   CTX-M ESBL NOT DETECTED NOT DETECTED Final   Carbapenem resistance IMP NOT DETECTED NOT DETECTED Final   Carbapenem resistance KPC NOT DETECTED NOT DETECTED Final   Carbapenem resistance NDM NOT DETECTED NOT DETECTED Final   Carbapenem resist OXA 48 LIKE NOT DETECTED NOT DETECTED Final   Carbapenem resistance VIM NOT DETECTED NOT DETECTED Final    Comment: Performed at [facility], [address], [place], [place] [address]  MRSA Next Gen by PCR, Nasal     Status: None   Collection Time: 01/23/22  1:02 PM   Specimen: Nasal Mucosa; Nasal Swab  Result Value Ref Range Status   MRSA by PCR Next Gen NOT DETECTED NOT DETECTED Final    Comment: (NOTE) The GeneXpert MRSA Assay (FDA approved for NASAL specimens only), is one component of a comprehensive MRSA colonization surveillance program. It is not intended to diagnose MRSA infection nor to guide or monitor treatment for MRSA infections. Test performance is not  FDA approved in patients less than 20 years old. Performed at [facility], [place] [address], [place], [place] [address]     Time coordinating discharge: 35 minutes  Signed: Marlowe Aschoff Ritesh Opara  [facility] 01/26/2022, 11:52 AM

## 2022-01-26 NOTE — Progress Notes (Signed)
  Transition of Care [facility]) Screening Note   Patient Details  Name: Mike Donaldson. Date of Birth: 09-05-1950   Transition of Care [facility]) CM/SW Contact:    Lennart Pall, LCSW Phone Number: 01/26/2022, 12:47 PM    Transition of Care Department [facility]) has reviewed patient and no TOC needs have been identified at this time. We will continue to monitor patient advancement through interdisciplinary progression rounds. If new patient transition needs arise, please place a TOC consult.

## 2022-01-26 NOTE — Progress Notes (Signed)
Dahal MD notified of retention and patient unable to void after foley removed this morning. Orders placed to insert foley prior to discharge. Patient and wife educated on foley care and maintenance. All questions answered.   AVS documentation reviewed with patient and wife. All belongings patient brought to hospital were returned and verified by wife and patient (ring, glasses, shoes, hat and clothes). All questions answered. Port needle removed by IV team and patient taken to front entrance and assisted into vehicle.

## 2022-01-28 LAB — LEGIONELLA PNEUMOPHILA SEROGP 1 UR AG: L. pneumophila Serogp 1 Ur Ag: NEGATIVE

## 2022-06-12 DEATH — deceased
